# Patient Record
Sex: Male | Born: 1950 | Race: White | Hispanic: No | Marital: Single | Smoking: Never smoker
Health system: Southern US, Community
[De-identification: ages and names within clinical notes are randomized; demographics above are authoritative.]

## PROBLEM LIST (undated history)

## (undated) DIAGNOSIS — Z951 Presence of aortocoronary bypass graft: Secondary | ICD-10-CM

## (undated) DIAGNOSIS — I10 Essential (primary) hypertension: Secondary | ICD-10-CM

## (undated) DIAGNOSIS — I452 Bifascicular block: Secondary | ICD-10-CM

## (undated) DIAGNOSIS — I214 Non-ST elevation (NSTEMI) myocardial infarction: Secondary | ICD-10-CM

## (undated) DIAGNOSIS — I2510 Atherosclerotic heart disease of native coronary artery without angina pectoris: Principal | ICD-10-CM

## (undated) DIAGNOSIS — E66811 Obesity, class 1: Secondary | ICD-10-CM

## (undated) DIAGNOSIS — I25810 Atherosclerosis of coronary artery bypass graft(s) without angina pectoris: Secondary | ICD-10-CM

## (undated) DIAGNOSIS — E669 Obesity, unspecified: Secondary | ICD-10-CM

## (undated) DIAGNOSIS — E785 Hyperlipidemia, unspecified: Secondary | ICD-10-CM

## (undated) DIAGNOSIS — Z87442 Personal history of urinary calculi: Secondary | ICD-10-CM

## (undated) HISTORY — DX: Essential (primary) hypertension: I10

## (undated) HISTORY — DX: Obesity, unspecified: E66.9

## (undated) HISTORY — DX: Atherosclerosis of coronary artery bypass graft(s) without angina pectoris: I25.810

## (undated) HISTORY — PX: TONSILLECTOMY: SUR1361

## (undated) HISTORY — PX: APPENDECTOMY: SHX54

## (undated) HISTORY — DX: Atherosclerotic heart disease of native coronary artery without angina pectoris: I25.10

## (undated) HISTORY — DX: Obesity, class 1: E66.811

## (undated) HISTORY — DX: Bifascicular block: I45.2

## (undated) HISTORY — PX: HERNIA REPAIR: SHX51

## (undated) HISTORY — DX: Presence of aortocoronary bypass graft: Z95.1

## (undated) HISTORY — DX: Non-ST elevation (NSTEMI) myocardial infarction: I21.4

## (undated) HISTORY — DX: Hyperlipidemia, unspecified: E78.5

---

## 2005-03-05 DIAGNOSIS — I2510 Atherosclerotic heart disease of native coronary artery without angina pectoris: Secondary | ICD-10-CM

## 2005-03-05 DIAGNOSIS — Z951 Presence of aortocoronary bypass graft: Secondary | ICD-10-CM

## 2005-03-05 DIAGNOSIS — I214 Non-ST elevation (NSTEMI) myocardial infarction: Secondary | ICD-10-CM

## 2005-03-05 HISTORY — DX: Atherosclerotic heart disease of native coronary artery without angina pectoris: I25.10

## 2005-03-05 HISTORY — DX: Presence of aortocoronary bypass graft: Z95.1

## 2005-03-05 HISTORY — PX: CORONARY ARTERY BYPASS GRAFT: SHX141

## 2005-03-05 HISTORY — DX: Non-ST elevation (NSTEMI) myocardial infarction: I21.4

## 2005-03-10 ENCOUNTER — Inpatient Hospital Stay (HOSPITAL_COMMUNITY): Admission: EM | Admit: 2005-03-10 | Discharge: 2005-03-19 | Payer: Self-pay | Admitting: Emergency Medicine

## 2005-03-11 HISTORY — PX: LEFT HEART CATH AND CORONARY ANGIOGRAPHY: CATH118249

## 2005-03-30 ENCOUNTER — Encounter
Admission: RE | Admit: 2005-03-30 | Discharge: 2005-03-30 | Payer: Self-pay | Admitting: Thoracic Surgery (Cardiothoracic Vascular Surgery)

## 2008-04-19 ENCOUNTER — Encounter: Admission: RE | Admit: 2008-04-19 | Discharge: 2008-04-19 | Payer: Self-pay | Admitting: Cardiovascular Disease

## 2010-04-04 HISTORY — PX: NM MYOCAR SINGLE W/SPECT: HXRAD625

## 2010-07-25 ENCOUNTER — Encounter: Payer: Self-pay | Admitting: Thoracic Surgery (Cardiothoracic Vascular Surgery)

## 2010-11-20 NOTE — Discharge Summary (Signed)
Jeremy [name], CEN NO.:  [id number]   MEDICAL RECORD NO.:  [id number]          PATIENT TYPE:  INP   LOCATION:  2035                         FACILITY:  [facility]   PHYSICIAN:  Salvatore Decent. Dorris Fetch, M.D.DATE OF BIRTH:  1951-03-28   DATE OF ADMISSION:  03/10/2005  DATE OF DISCHARGE:                                 DISCHARGE SUMMARY   PRIMARY DIAGNOSES:  Severe three vessel coronary artery disease status post  myocardial infarction.   IN-HOSPITAL DIAGNOSES:  1.  Acute blood loss anemia postoperatively.  2.  Volume overload.  3.  Acute renal insufficiency.   SECONDARY DIAGNOSES:  1.  History of inguinal hernia repair on the left.  2.  Appendectomy.  3.  Tonsillectomy.   ALLERGIES:  NO KNOWN DRUG ALLERGIES.   IN-HOSPITAL OPERATIONS PROCEDURES:  1.  Cardiac catheterization.  2.  Median sternotomy with extracorporal circulation coronary bypass      grafting x5 using a left internal mammary artery posterior anterior      descending, free right internal mammary artery to obtuse marginal-1,      saphenous vein graft to first diagonal, saphenous vein graft to      posterior descending and posterolateral.   HISTORY AND PHYSICAL AND HOSPITAL COURSE:  Jeremy [name] is a 60 year old  gentleman with no prior cardiac history.  He noted one day prior to  admission, which was the date questioned of March 09, 2005, that he had  chest pain with walking to his mailbox.  He subsequently went to work and he  had some recurrent pain with exertion at work.  It did radiate to his  shoulders.  He described it as a pressure sensation.  When he was active the  intensity increased and then it relieved with rest, but it waxed and waned  all night and did not ever completely go away.  He subsequently presented to  the emergency room.  His cardiac enzymes were slightly elevated initially  and he was continued to have some slight chest pressure.  He was treated  with intravenous heparin,  nitroglycerin.  He had already taken two aspirin  at home.  He subsequently ruled in for a myocardial infarction with a CK of  647, MB of 28 and troponin of 0.79.  He subsequently was taken to cardiac  catheterization laboratory by Dr. Alanda Amass on March 11, 2005, and was  found to have a total occlusion of the OM2 and had severe three vessel  coronary artery disease with 90-95% ostial LAD.  The patient was pain-free  following catheterization.  CVTS was consulted following catheterization.  Patient was seen and evaluated by Dr. Dorris Fetch.  Dr. Dorris Fetch  discussed with the patient undergoing coronary artery bypass grafting for  these stenotic areas.  He discussed the risks and benefits of this  procedure.  Patient acknowledged an understanding and wished to proceed.  Patient was scheduled for surgery for March 12, 2005.  Patient had  preoperative bilateral carotid duplex ultrasound done showing no ICA  stenosis.  He also had preoperative ABIs which were within normal limits.  Patient was admitted to [facility] on March 10, 2005.  Patient was seen  to have a myocardial infarction.  He was taken for cardiac catheterization  on March 11, 2005.  Patient was taken to the operating room on March 12, 2005, by Dr. Dorris Fetch where he underwent coronary artery bypass  grafting x5 using left internal mammary artery to left anterior descending,  free right internal mammary artery to obtuse marginal-1, saphenous vein  graft to first diagonal, saphenous vein graft to posterior descending and  posterolateral.  Patient tolerated this procedure well and was transferred  up to the Intensive Care Unit in stable condition.  Immediately  postoperatively patient was seen to be hemodynamically stable.  He was  extubated evening of surgery.  Patient's postoperative course was pretty  much unremarkable.  Postoperative day 1 he was out of bed ambulating.  Patient's neuro was intact.  He was  in normal sinus rhythm.  He remained  hemodynamically stable.  Chest tubes and lines were dc'd.  Patient seemed to  be volume overload and started on diuretic.  Creatinine was seen to be  stable at 1.3.  As mentioned above, he was hemodynamically stable with H&H  of 10 and 28.5 with platelet count of 126.  Postop day 2, he remained in  normal sinus rhythm.  He remained hemodynamically stable.  Patient was out  of bed ambulating well.  He was sating 91-94% on 2 liters.  Creatinine had  seemed to have jumped from 1.3 to 1.8.  This was monitored.  Diuretics were  continued due to patient's volume overload.  Patient was transferred out to  2000 day 2.  Postop day 3, the patient was seen to be sating 95% on room  air.  Creatinine bumped up even further from 1.8 to 2.1.  Again diuretics  were continued.  Ace inhibitor was held off from being started.  Patient  remained in normal sinus rhythm.  Incisions were dry and intact and healing  well.  Postop day 4, patient's sternal incision, distal portion, showed  bloody serous drainage.  Skin edges were intact, no erythema or purulent  drainage noted.  This was monitored.  Creatinine again had jumped from 2.1  to 2.2.  Thought that this was due to __________ and that creatinine would  decrease over the next several days.  Diuretics were continued due to the  volume overload.  Noted, cultures were sent of the sternal drainage  postoperative day 4.  Patient remained in normal sinus rhythm.  Over the  next several days patient's creatinine began to decrease some.  It was down  to 1.9 on postop day 6.  Sternal drainage cultures showed negative.  Patient  was started on Keflex empirically on postop day 6.  Patient was out of bed  ambulating well.  Continued volume overload and diuretics were continued.  He remained hemodynamically stable throughout.  He remained to be in normal  sinus rhythm.   Patient is tentatively ready for discharge postoperative day  7.  He will be discharged home in normal sinus rhythm.  Patient will be discharged home on  room air in normal sinus rhythm with incisions healing well.  A followup  appointment is scheduled with Dr. Dorris Fetch for April 08, 2005, at 11:45  a.m.  Patient will obtain a PA & lateral chest x-ray one hour prior to this  appointment.  He will followup with Dr. Alanda Amass in 2 weeks.  He will need  to call  and schedule an appointment.  Jeremy [name] received instructions on  diet, activity level and incisional care.  He was told no driving until  released to do so, no heavy lifting over 10 pounds.  He was told to ambulate  3-4 times per day, progress as tolerated.  He was also educated to continue  his breathing exercises.  The patient was told he is allowed to shower,  washing his incisions using soap and water.  He is to contact the office if  his sternal incision starts to open or drain any purulent drainage.  He will  be discharged home on antibiotic for several days.  He was educated on diet  to be low fat, low salt.   DISCHARGE MEDICATIONS:  1.  Aspirin 325 mg p.o. daily.  2.  Lopressor 25 mg p.o. b.i.d.  3.  Zocor 40 mg p.o. daily.  4.  Keflex 500 mg p.o. t.i.d. x6 days.  5.  Lasix 40 mg p.o. daily x7 days.  6.  Potassium chloride 20 mEq p.o. daily x7 days.  7.  Niferex 150 mg p.o. daily.  8.  Folic acid 1 mg p.o. daily.  9.  Tylox 1-2 tabs p.o. q.4-6 hours p.r.n. pain.      Jeremy Belfast, PA    ______________________________  Salvatore Decent Dorris Fetch, M.D.    KMD/MEDQ  D:  03/18/2005  T:  03/18/2005  Job:  [id number]   cc:   Salvatore Decent. Dorris Fetch, M.D.  [address]  [place]  [place] [address]   Richard A. Alanda Amass, M.D.  [phone] [address], [address]  [place]  [place] [address]  Fax: [phone]

## 2010-11-20 NOTE — H&P (Signed)
Jeremy [name], Jeremy [name]                  ACCOUNT NO.:  [id number]   MEDICAL RECORD NO.:  [id number]          PATIENT TYPE:  EMS   LOCATION:  MAJO                         FACILITY:  [facility]   PHYSICIAN:  Richard A. Alanda Amass, M.D.DATE OF BIRTH:  [birth year]   DATE OF ADMISSION:  03/10/2005  DATE OF DISCHARGE:                                HISTORY & PHYSICAL   CHIEF COMPLAINT:  Chest pain.   HISTORY OF PRESENT ILLNESS:  Jeremy [name] is a 60 year old male followed by Dr.  Jeanie Sewer in [place] who works at the [employer] there.  He  has never had cardiac problems.  He has been very healthy and takes no  medications.  Yesterday, he had some chest pressure when he walked out to  his mailbox.  Yesterday afternoon, he had recurrent chest pressure at work.  He works second shift.  He describes substernal chest pressure that goes to  his shoulders.  It was worse with activity and eased with rest.  He actually  had pain off and on most of the night and only sweated a little bit.  He is  now seen in the emergency room.  His initial markers are elevated, with a  Troponin of .07.  He continues to have slight chest pressure.   PAST MEDICAL HISTORY:  Remarkable only for a prior inguinal hernia repair on  the left and a tonsillectomy.  He denies any diabetes, hypertension, or  hyperlipidemia.  He takes no medications at home.  He has no known drug  allergies.   SOCIAL HISTORY:  He never smoked.  He has a 11 year old son.  He is married  and lives with his wife.  As noted, he works at the [employer]  in [place].   FAMILY HISTORY:  Remarkable for coronary disease  His father is alive at 75  and had bypass about 6 years ago.  His mother is alive at 35 and had an MI  in her late 21s.  She also has hypertension.  The patient has one brother  and one sister, both healthy.   REVIEW OF SYSTEMS:  Essentially unremarkable except as noted above.  He  denies any history of kidney disease or  kidney problems or dysuria.  He has  not had prostate problems.  He denies any melena or GI bleeding.  He has not  had thyroid problems.  There is no history of diabetes or hypertension and  his lipid status is unknown.   PHYSICAL EXAMINATION:  VITAL SIGNS:  Blood pressure is 165/98, pulse 66,  respirations 12, O2 saturation is 98% on 2 liters.  GENERAL:  He is a well-nourished, well-developed male in no acute distress.  HEENT:  Normocephalic.  Extraocular movements are intact.  Eyes are  nonicteric.  NECK:  Without bruit and without JVD.  CHEST:  Clear to auscultation and percussion.  CARDIAC EXAM:  Regular rate and rhythm without murmur, rub or gallop, normal  S1, S2.  ABDOMEN:  Nontender, no hepatosplenomegaly.  EXTREMITIES:  Without edema.  Distal pulses are intact.  NEURO EXAM:  Grossly  intact.  He is alert and oriented and cooperative,  moves all extremities without obvious deficits.  SKIN:  Warm and dry.   As noted, his initial markers are positive, with an MB of 10.5 and a  Troponin of 0.7.  His creatinine is 0.08.  His myoglobin is 304.  His EKG  shows a sinus rhythm without acute changes.   IMPRESSION:  1.  Unstable angina, possible subendocardial myocardial infarction.  2.  Family history of coronary disease.   PLAN:  The patient was seen by Dr. Alanda Amass and myself today in the office.  He is already on IV Heparin and took 2 aspirin this morning on his own.  He  has IV nitroglycerine running.  We will admit him to the CCU and start  Integralin.  Dr. Alanda Amass would also like to give him a dose of Plavix.  He  will be put on the catheterization schedule today if possible, if not  tomorrow.      Abelino Derrick, P.A.      Richard A. Alanda Amass, M.D.  Electronically Signed    LKK/MEDQ  D:  03/10/2005  T:  03/10/2005  Job:  [id number]

## 2010-11-20 NOTE — Consult Note (Signed)
Jeremy [name], Jeremy [name]                  ACCOUNT NO.:  [id number]   MEDICAL RECORD NO.:  [id number]          PATIENT TYPE:  INP   LOCATION:  2929                         FACILITY:  [facility]   PHYSICIAN:  Salvatore Decent. Dorris Fetch, M.D.DATE OF BIRTH:  1950/12/10   DATE OF CONSULTATION:  03/11/2005  DATE OF DISCHARGE:                                   CONSULTATION   REASON FOR CONSULTATION:  Three-vessel disease, status post MI.   CHIEF COMPLAINT:  Chest tightness.   HISTORY OF PRESENT ILLNESS:  Jeremy [name] is a 60 year old gentleman with no  prior cardiac history. He noted one day prior to admission which was the  date question was March 09, 2005 that he had chest pain with walking to  his mailbox.  He subsequently went to work and he had some  recurrent pain  with exertion at work.  It did radiate to his shoulders.  He described it as  a pressure sensation.  When he was active, the intensity increased and then  it relieved with rest, but it waxed and waned all night and did not every  completely go away.  He subsequently presented to the emergency room.  His  cardiac enzymes were slightly elevated initially and he was continuing to  have some slight chest pressure. He was treated with intravenous heparin and  nitroglycerin. He had already taken two aspirin at home. He subsequently  ruled in for myocardial infarction with a CK of 647, MB of 28 and troponin  of  0.79.  He subsequently was taken to cardiac catheterization laboratory  by Dr. Alanda Amass this morning and was found to have a total occlusion of OM2  and had severe three-vessel disease with a 90 to 95%  ostial LAD stenosis.  The patient currently is pain free.   PAST MEDICAL HISTORY:  History of inguinal hernia repair on the left,  appendectomy, tonsillectomy.  Denies any history of diabetes, hypertension,  hyperlipidemia, or previous coronary disease.   He was on no medications prior to admission.   He had no known drug  allergies.   FAMILY HISTORY:  His father had bypass surgery but not until his late 17s.  He says there is a history of some members of his father's family dying in  their 3s and 48s in the past from unknown causes.  He has two siblings,  both are  healthy.   SOCIAL HISTORY:  He is married, he lives with his wife.  He works at the  [employer] in [place].  He does not smoke.   REVIEW OF SYSTEMS:  He has been feeling fatigued with less energy for  approximately a year. He does wear glasses for reading.  He denies any  paroxysmal nocturnal dyspnea, orthopnea or peripheral edema. He denies any  claudication type symptoms. All other systems were negative.   PHYSICAL EXAMINATION:  Jeremy [name] is a 60 year old gentleman in no acute  distress. He is rather stoic.  His blood pressure was 122/66, pulse 65,  respirations 18, oxygen saturations 94%.  He is 5 feet 11 inches tall,  235  pounds.  In general he is well-developed, well-nourished in no acute  distress.   HEENT:  Exam is unremarkable.  NECK:  Supple without thyromegaly, adenopathy or bruits.  CARDIAC:  Exam is a regular rate and rhythm, normal S1, S2, no murmurs, rubs  or gallops.  LUNGS:  Are with equal breath sounds bilaterally. There is no rales or  wheezing.  ABDOMEN:  Soft, nontender.  EXTREMITIES:  No cyanosis, clubbing or edema .  He has slight delay with  refilling with radial compression on the right side. He is left handed.  Skin is warm and dry. He had 2+ pulses throughout.   LABORATORY DATA:  White count is 10, hematocrit 48. Platelets 226,  hemoglobin A1c 5.5, TSH is 4.5.  Sodium 138, potassium 3.9, BUN and  creatinine of 11 and 0.8.  Glucose was 108 on admission.  His peak CK was  1190 with an MB of 116 and a troponin of 10.7.  His cholesterol was 217,  triglycerides 150, HDL 44, LDL 143, EKG showed nonspecific T-wave  abnormality.   IMPRESSION:  Jeremy [name] is a 60 year old gentleman with no prior cardiac   history.  He presents with a non-Q-wave MI. He has severe three-vessel  disease with essentially normal left ventricular function. Coronary bypass  grafting is indicated for ___________ benefit and relief of symptoms.  The  only issue with him is that he did receive 300 mg of Plavix yesterday in  anticipation of possible angioplasty at time of catheterization.  I reviewed  our options which would be to wait approximately a week for the Plavix to  wear off and decrease the bleeding risk but increase risk of recurrent  ischemia potentially in the LAD distribution which would be potentially  disastrous for him, given his previous circumflex infarct versus proceeding  with coronary grafting with the use of aprotinin and accepting somewhat the  increased bleeding risk. Jeremy [name] wishes to proceed with surgery without  delay.   I discussed in detail with the patient and his wife the indications, risks,  benefits and alternatives.  They understand the risks include but not  limited to death, stroke MI, DVT, PE, bleeding, possible need for  transfusions, infections as well as other organ system dysfunction including  respiratory, renal, hepatic or GI complications.  He understands and accepts  these risks and agrees to proceed.   We will plan bilateral mammary arteries with potential using the right  mammary as a free graft to the circumflex.  Will plan to graft the PD and PL  sequentially. He had some mild stenosis classified at 60% in the LAD beyond  the take off of the large second diagonal.  Will plan to graft that diagonal  with a vein graft and use the left mammary artery for the LAD.  All of Mr.  Matt and his wife's questions were answered.  Will plan to proceed with  surgery in the morning.           ______________________________  Salvatore Decent. Dorris Fetch, M.D.     SCH/MEDQ  D:  03/11/2005  T:  03/12/2005  Job:  [id number]   cc:   Gerlene Burdock A. Alanda Amass, M.D. [phone] [address], [address]   [place]  [place] [address]  Fax: [phone]   Antonietta Breach, M.D.

## 2010-11-20 NOTE — Discharge Summary (Signed)
NAMEVERNON, MAISH NO.:  [id number]   MEDICAL RECORD NO.:  [id number]          PATIENT TYPE:  INP   LOCATION:  2035                         [facility]ACILITY[facility]  [facility]   PHYSICIAN:  Theda Belfast, PA DATE OF BIRTH:  Jan 28, 1951   DATE OF ADMISSION:  03/10/2005  DATE OF DISCHARGE:                                 DISCHARGE SUMMARY   Audio too short to transcribe (less than 5 seconds)      Theda Belfast, PA     KMD/MEDQ  D:  03/18/2005  T:  03/18/2005  Job:  [id number]

## 2010-11-20 NOTE — Cardiovascular Report (Signed)
Jeremy [name], Jeremy [name]                  ACCOUNT NO.:  [id number]   MEDICAL RECORD NO.:  [id number]          PATIENT TYPE:  INP   LOCATION:  2906                         FACILITY:  [facility]   PHYSICIAN:  Richard A. Alanda Amass, M.D.DATE OF BIRTH:  27-Feb-1951   DATE OF PROCEDURE:  03/11/2005  DATE OF DISCHARGE:                              CARDIAC CATHETERIZATION   PROCEDURE:  1.  Retrograde central aortic catheterization __________ coronary      angiography via Judkins technique.  2.  Left ventricular angiogram, right anterior oblique/left anterior oblique      projection, subselective left internal mammary artery, right internal      mammary artery.  3.  Abdominal aortic angiogram, mid-stream posteroanterior projection and      hand injection.  4.  StarClose nitinol clip closure, C right femoral artery, successful.   DESCRIPTION OF PROCEDURE:  The patient was brought to the second floor CP  lab in the post-absorptive state after 5 mg of Valium p.o. premedication.  The right groin was prepped, draped in the usual manner.  Xylocaine 1% was  used for local anesthesia.  The patient was given 2 mg of Versed for  sedation.  The patient was not given Plavix on the day of the procedure; he  was on Integrilin drip and heparin was held.  The C RFA was entered with a  single anterior puncture using an 18 thin-walled needle and with a modified  Seldinger technique.  The 6-French short Diag sidearm sheaths were inserted  without difficulty.  Catheter was done with 6-French 4-cm tapered preformed  Cordis coronary and pigtail catheters using Omnipaque dye throughout the  procedure.  Selective coronary angiography was followed by subselective LIMA  and RIMA with the right coronary catheter by hand injection and then LV  angiogram in the RAO and LAO projection, 25 mL at 14 mL/sec and 20 mL at 12  mL/sec, respectively.  Pullback pressure in the CA was performed and showed  no gradient across the aortic valve.   Abdominal aortic angiogram was done  above the level of the renal arteries with a single hand injection.  The  catheter was removed and sidearm sheath was flushed.  A single injection  through the sidearm sheath showed good position of the sheath in the C RFA  which was smooth with no stenosis.  A StarClose nitinol clip closure device  was then utilized for successful closure of the right femoral artery.  The  right groin was stable and the patient was transferred back to the holding  area for postoperative care.  He will be continued on Integrilin and heparin  restarted pending CVTS consultation for high-grade three-vessel coronary  disease with well-preserved LV function.   PRESSURES:  LV:  120/0; LVEDP 16 mmHg; A 18-22 mmHg.  CA:  120/84 mmHg.   There is no gradient across the aortic valve on catheter pullback.   The LIMA and RIMA are widely patent.  Vertebrals are antegrade and there is  no subclavian or brachiocephalic stenosis.   The renal arteries are single and normal bilaterally.  The  infrarenal  abdominal aorta was smooth with no significant stenosis.  The iliacs were  mildly tortuous, but widely patent.   Fluoroscopy revealed 1+ LAD calcification and right coronary calcification.  No significant intracardiac or valvular calcification.   LV angiogram demonstrated some minimal hypokinesis in the mid-anterolateral  wall and posteroapical segment, no mitral regurgitation.  EF was greater  than 50%.   The main left coronary was normal.   The ostial and proximal LAD had 90% to 95% segmental concentric stenosis  without thrombus.  There were tandem post-stenotic dilatation areas.  There  was another 60% lesion at the junction of the proximal and mid-third after  the DX-2, which was moderately large and bifurcated with no significant  stenosis.   The DX-1 arose from the proximal third of the LAD before __________ 1 after  the LAD lesion bifurcated, was of moderate size and  had no significant  stenosis.  The LAD coursed to  the apex of the heart and undersurface.   The circumflex artery gave off a bifurcating proximal first marginal branch  with no significant stenosis, followed by an atrial and then an AV groove  branch.  The second marginal branch was totally occluded with no significant  antegrade filling and this was felt to be the probable culprit lesion.  The distal circumflex was tortuous, large, with no significant stenosis.   The right coronary was a dominant large vessel; it was ectatic in the  proximal third without significant stenosis.  There was 50% to 60% stenosis  at the junction of the proximal third and another 75% concentric stenosis  before the PLA and PDA bifurcation which had no significant disease beyond.   DISCUSSION:  This 60 year old white married father of 1 son is a nonsmoker  and has history of exogenous obesity and known elevated cholesterol, family  history of coronary disease with his father having CA, dying in his 31s, and  prior CABG, and mother having an MI in her 57s.  He has been on no regular  medication and has had 2 days of unstable angina and then prolonged  substernal chest pressure, prompting admission March 10, 2005.  He was  treated with IIb/IIIa inhibitors, given 300 mg of Plavix in the ER and  aspirin and heparin, nitroglycerin and beta blockers.  He has remained  stable in the hospital, has developed enzyme elevations with troponin of 10,  CK of 1190, MB of 116.  Angiography demonstrated high-grade ostial LAD  stenosis, segmental, high-grade distal RCA and total occlusion of his OM-2  (culprit lesion).  I believe the patient is a candidate for multivessel  CABG.  He did receive a single dose of 300 mg of Plavix in the emergency  room on March 10, 2005 which is cause for concern of potential bleeding and may have to delay his CABG.  We will continue heparin and IIb/IIIa  inhibitor along with medical  therapy in the interim.  I have discussed the  case with CVTS, Dr. Dorris Fetch, and he will see him in consultation.   CATHETERIZATION DIAGNOSES:  1.  Arteriosclerotic heart disease -- acute coronary syndrome with posterior      wall myocardial infarction, minimal left ventricular dysfunction, second      obtuse marginal occlusion, March 10, 2005.  2.  Hypertension, no prior treatment or diagnosis.  3.  History of hyperlipidemia.  4.  Family history of coronary disease.  5.  Nonsmoker.  6.  Exogenous obesity.  7.  High-grade  three-vessel coronary disease with well-preserved left      ventricular function.      Richard A. Alanda Amass, M.D.  Electronically Signed     RAW/MEDQ  D:  03/11/2005  T:  03/11/2005  Job:  [id number]   cc:   [facility]   [place], [place], Jonelle Sports MD   Salvatore Decent Dorris Fetch, M.D.  [address]  [place]  [place] [address]

## 2010-11-20 NOTE — Op Note (Signed)
NAMEDARNELLE, [employer]                  ACCOUNT NO.:  [id number]   MEDICAL RECORD NO.:  [id number]          PATIENT TYPE:  INP   LOCATION:  2316                         FACILITY:  [facility]   PHYSICIAN:  Salvatore Decent. Dorris Fetch, M.D.DATE OF BIRTH:  1950/10/09   DATE OF PROCEDURE:  03/12/2005  DATE OF DISCHARGE:                                 OPERATIVE REPORT   PREOPERATIVE DIAGNOSES:  Severe three-vessel coronary disease, status post  myocardial infarction.   POSTOPERATIVE DIAGNOSES:  Severe three-vessel coronary disease, status post  myocardial infarction.   PROCEDURES:  Median sternotomy, extracorporeal circulation coronary bypass  grafting x5 (left internal mammary artery to left anterior descending, free  right internal mammary artery to obtuse marginal 1, saphenous vein graft to  first diagonal, saphenous vein graft to posterior descending and posterior  lateral).   SURGEON:  Salvatore Decent. Dorris Fetch, M.D.   ASSISTANT:  Coral Ceo, P.A.   ANESTHESIA:  General.   FINDINGS:  Veins:  Small but satisfactory good quality mammaries, good  quality targets, lateral wall infarct with edema.   CLINICAL NOTE:  Mr. Kargbo is a 60 year old gentleman who presents following a  prolonged episode of substernal chest pain. He ruled in by enzymes. At  catheterization, he had severe three-vessel coronary disease and was  referred for coronary artery bypass grafting.  The indications, risks,  benefits, and alternatives were discussed in detail with the patient, and he  understood and accepted the risks as outlined in the previous note and  agreed to proceed.   OPERATIVE NOTE:  Mr. Burch was brought the preoperative holding area on  March 12, 2005. There lines were placed by the anesthesia service to  monitor arterial, central venous, and pulmonary arterial pressure. The EKG  leads were placed for continuous telemetry. Intravenous antibiotics were  administered. The patient was taken to the operating  room, anesthetized, and  intubated. A Foley catheter was placed. The chest, abdomen, and legs were  prepped and draped in the usual fashion.   A median sternotomy was performed. The left internal mammary artery was  harvested using standard technique.  Five thousand units of heparin was  administered during takedown of the graft. Simultaneously, an incision in  the medial aspect of the left leg at the level of the knee.  Only a very  small vein could be isolated at th at site.  It was not harvested. This  incision was closed. A second incision was made in the medial aspect of the  right leg again at the level of the knee, and no dominant greater saphenous  vein could be found in that region.  Therefore, the vein was harvested using  an open technique from the right-sided.  The vein was small but was  satisfactory.   The right internal mammary artery then was harvested again using the same  technique.  The remainder of the full heparin dose was given prior to  dividing the distal end of the right mammary artery.  There was excellent  flow through this graft.  It subsequently was divided proximally, and the  proximal  stump was suture ligated with a 2-0 silk suture.   After confirming adequate anticoagulation with ACT measurement taking into  account aprotinin utilization, the pericardium was opened. The ascending  aorta was inspected and palpated. There is no palpable atherosclerotic  disease. The aorta was cannulated via concentric 2-0 Ethibond pursestring  sutures.  A dual stage venous cannula was placed via a pursestring suture in  the right atrial appendage. Cardiopulmonary bypass was instituted, and the  patient was cooled to 32 degrees Celsius.  The coronary arteries were  inspected, and anastomotic sites were chosen. The conduits were inspected  and cut to length.  A foam pad was placed in the pericardium to protect the  left phrenic nerve.  A temperature probe was placed in the  myocardial  septum, and a cardioplegic cannula was placed in the ascending aorta.   The aorta was cross clamped.  The left ventricle and the aortic root vent  were placed.  Cardiac arrest was achieved combination of cold antegrade  blood cardioplegia and topical iced saline. After achieving a complete  diastolic arrest and adequate myocardial septal cooling, the following  distal anastomoses were performed.   First, a reverse saphenous vein graft was placed sequential to the posterior  descending and posterolateral branches of the right coronary.  These were  both 1.5-mm good quality targets.  A side-to-side anastomosis was performed  to the posterior descending, and an end-to-side was performed to the  posterolateral.  All anastomoses were probed proximally and distally at  their completion prior to tying the suture.  The graft flushed easily.  Cardioplegia was administered. There was good hemostasis.   Next, a reverse saphenous vein graft was placed end-to-side to the first  diagonal branch. of the LAD. This was compromised by the 95% tight proximal  LAD stenosis as well as a 60% stenosis in the LAD just beyond the takeoff of  the diagonal.  The diagonal itself was free of disease. It was a 1.5-mm good  quality target. The vein graft was anastomosed end-to-side with a running  simple 7-0 Prolene suture. Again, there was good flow and good hemostasis.   Next, the distal end of the right internal mammary artery was spatulated and  was anastomosed end-to-side to obtuse marginal 1. The OM-1 was a 1.5-mm good  quality target. The anastomosis was performed with a running 8-0 Prolene  suture.   Additional cardioplegia was administered into the vein grafts and the aortic  root. There was good backbleeding from the right mammary artery.   The left internal mammary artery then was brought through a window in the pericardium. The distal end was spatulated and was anastomosed end-to-side  to  the mid-LAD. The LAD was a 1.5-mm good quality target. The anastomosis  was performed with a running 8-0 Prolene suture. At the completion of the  mammary to LAD anastomosis, the Bulldog clamp was briefly removed from the  mammary artery to inspect for hemostasis. Immediate rapid septal rewarming  was noted.  The Bulldog clamp was replaced. The mammary pedicle was tacked  to the epicardial surface of the heart with 6-0 Prolene sutures.   Additional cardioplegia then was administered. The right mammary vein grafts  were cut to length proximally, and the proximal anastomoses were performed  to 4.0-mm punch aortotomies with running 7-0 Prolene sutures. At the  completion of the final proximal anastomoses, the patient was placed in  Trendelenburg position. Lidocaine was administered. The Bulldog clamp was  again removed from  the left mammary artery. The aortic root was de-aired,  and the aortic cross-clamp was removed. The total cross-clamp time was 96  minutes.   While the patient was being rewarmed, all proximal and distal anastomoses  were inspected for hemostasis. Epicardial pacing wires were placed on the  right ventricle and right atrium. When the patient rewarmed to a core  temperature of37 degrees Celsius, he  was weaned from cardiopulmonary bypass  without difficulty. Total bypass time was 142 minutes. The initial cardiac  index was greater than 2 L/minute per meter squared.   A test dose of protamine was administered and was well tolerated. The atrial  and aortic cannulae were removed. The remainder of the protamine was  administered. There was a transient drop in systemic pressure. There was no  pulmonary hypertension response. About midway through the protamine  infusion, this responded to volume and Neo-Synephrine, and there were no  further problems. Hemostasis was achieved. The chest was irrigated with 1  liter of warm normal saline containing 1 g of vancomycin. The  pericardium  was reapproximated with interrupted 3-0 silk sutures. Just prior to closing  the pericardium, there was a transient decrease in the patient's cardiac  index, so a low-dose dopamine infusion was initiated. Bilateral pleural and  two mediastinal chest tubes were placed through separate subcostal  incisions. The sternum was closed with interrupted heavy-gauge single and  double stainless steel wires. The remaining incisions were closed in  standard fashion. Subcuticular closure was used for the skin.  All sponge,  needle, and instrument counts were correct at the end of the procedure. The  patient was taken from the operating room to the surgical intensive care  unit in critical but stable condition.           ______________________________  Salvatore Decent Dorris Fetch, M.D.     SCH/MEDQ  D:  03/12/2005  T:  03/12/2005  Job:  [id number]   cc:   Gerlene Burdock A. Alanda Amass, M.D.  [phone], [address] [place]  [place] [address]  Fax: [phone]

## 2011-05-06 HISTORY — PX: TRANSTHORACIC ECHOCARDIOGRAM: SHX275

## 2012-11-24 ENCOUNTER — Emergency Department (HOSPITAL_COMMUNITY): Payer: Managed Care, Other (non HMO)

## 2012-11-24 ENCOUNTER — Encounter (HOSPITAL_COMMUNITY): Payer: Self-pay | Admitting: *Deleted

## 2012-11-24 ENCOUNTER — Emergency Department (HOSPITAL_COMMUNITY)
Admission: EM | Admit: 2012-11-24 | Discharge: 2012-11-25 | Disposition: A | Discharge status: Home / Self Care | Payer: Managed Care, Other (non HMO) | Attending: Emergency Medicine | Admitting: Emergency Medicine

## 2012-11-24 DIAGNOSIS — Z951 Presence of aortocoronary bypass graft: Secondary | ICD-10-CM | POA: Insufficient documentation

## 2012-11-24 DIAGNOSIS — M79609 Pain in unspecified limb: Secondary | ICD-10-CM | POA: Insufficient documentation

## 2012-11-24 DIAGNOSIS — I10 Essential (primary) hypertension: Secondary | ICD-10-CM | POA: Insufficient documentation

## 2012-11-24 DIAGNOSIS — R202 Paresthesia of skin: Secondary | ICD-10-CM

## 2012-11-24 DIAGNOSIS — R209 Unspecified disturbances of skin sensation: Secondary | ICD-10-CM | POA: Insufficient documentation

## 2012-11-24 DIAGNOSIS — E7800 Pure hypercholesterolemia, unspecified: Secondary | ICD-10-CM | POA: Insufficient documentation

## 2012-11-24 DIAGNOSIS — Z7982 Long term (current) use of aspirin: Secondary | ICD-10-CM | POA: Insufficient documentation

## 2012-11-24 DIAGNOSIS — Z79899 Other long term (current) drug therapy: Secondary | ICD-10-CM | POA: Insufficient documentation

## 2012-11-24 LAB — CBC WITH DIFFERENTIAL/PLATELET
Basophils Absolute: 0 10*3/uL (ref 0.0–0.1)
Eosinophils Absolute: 0.2 10*3/uL (ref 0.0–0.7)
HCT: 48.8 % (ref 39.0–52.0)
Monocytes Absolute: 1.2 10*3/uL — ABNORMAL HIGH (ref 0.1–1.0)
Monocytes Relative: 12 % (ref 3–12)
Platelets: 253 10*3/uL (ref 150–400)
RDW: 12.9 % (ref 11.5–15.5)
WBC: 10 10*3/uL (ref 4.0–10.5)

## 2012-11-24 LAB — COMPREHENSIVE METABOLIC PANEL
Alkaline Phosphatase: 81 U/L (ref 39–117)
CO2: 26 mEq/L (ref 19–32)
Chloride: 103 mEq/L (ref 96–112)
Creatinine, Ser: 1.05 mg/dL (ref 0.50–1.35)
GFR calc non Af Amer: 75 mL/min — ABNORMAL LOW (ref >90)
Glucose, Bld: 105 mg/dL — ABNORMAL HIGH (ref 70–99)
Total Bilirubin: 0.4 mg/dL (ref 0.3–1.2)

## 2012-11-24 LAB — POCT I-STAT TROPONIN I

## 2012-11-24 MED ORDER — ASPIRIN 325 MG PO TABS
325.0000 mg | ORAL_TABLET | ORAL | Status: AC
  Administered 2012-11-24: 325 mg via ORAL
  Filled 2012-11-24: qty 1

## 2012-11-24 NOTE — ED Notes (Signed)
Pt stated some tingling to left hand for one week, and comes and goes, feel like left hand goes to sleep at time. Denies any numbness to that left hand.  Denies any chest pain or SOB,deines any weakness. Hx bypass 2006. Stated taken Asprin with no relief.

## 2012-11-24 NOTE — ED Notes (Addendum)
C/o intermitant L arm tingling x1 week. Denies CP. Also reports "have been dealing with bad gallbladder also", some nv. Last emesis this am. Pt continually clearing throat. Mentions last EKG with Dr. Alanda Amass [facility]) was not that good. Poor historian. H/o CABG noted.

## 2012-11-24 NOTE — ED Notes (Signed)
New EKG given to Dr Fonnie Jarvis

## 2012-11-25 NOTE — ED Provider Notes (Signed)
History     CSN: [id number]  Arrival date & time 11/24/12  2128   First MD Initiated Contact with Patient 11/25/12 0005      Chief Complaint  Patient presents with  . Arm Pain    (Consider location/radiation/quality/duration/timing/severity/associated sxs/prior treatment) HPI 62 yo M with CAD, s/p 5v CABG in 2006. Patient presents with complaints of intermittent paresthesias in his left forearm and hand. "Like when you go to sleep on it wrong". Sx involve all 5 fingers. No motor weakness, per patient. Patient says that sx seem to come on when he is sitting down. Pt is RHD. Denies neck pain. No h/o trauma. No cp or back pain or sob.   Patient says that because he had not had any cardiac evaluation since 2006, he felt that he should come on in and get checked out. Sx free at this time.   Patient also wants to have his GB checked. He has had intermittent RUQ discomfort with nausea for the past few months. No fever. No vomiting.    Past Medical History  Diagnosis Date  . Hypertension   . Hypercholesteremia     Past Surgical History  Procedure Laterality Date  . Appendectomy    . Hernia repair    . Tonsillectomy    . Cardiac surgery      No family history on file.  History  Substance Use Topics  . Smoking status: Never Smoker   . Smokeless tobacco: Not on file  . Alcohol Use: No      Review of Systems Gen: no weight loss, fevers, chills, night sweats Eyes: no discharge or drainage, no occular pain or visual changes Nose: no epistaxis or rhinorrhea Mouth: no dental pain, no sore throat Neck: no neck pain Lungs: no SOB, cough, wheezing CV: no chest pain, palpitations, dependent edema or orthopnea Abd: no abdominal pain, nausea, vomiting GU: no dysuria or gross hematuria MSK: no myalgias or arthralgias Neuro: As per history of present illness, otherwise Skin: no rash Psyche: negative.  Allergies  Review of patient's allergies indicates no known  allergies.  Home Medications   Current Outpatient Rx  Name  Route  Sig  Dispense  Refill  . aspirin EC 81 MG tablet   Oral   Take 81 mg by mouth daily.         [name] lisinopril (PRINIVIL,ZESTRIL) 10 MG tablet   Oral   Take 10 mg by mouth daily.         . Multiple Vitamin (MULTIVITAMIN WITH MINERALS) TABS   Oral   Take 1 tablet by mouth daily.         . rosuvastatin (CRESTOR) 20 MG tablet   Oral   Take 20 mg by mouth daily.           BP 138/95  Pulse 64  Temp(Src) 99.1 F (37.3 C) (Oral)  Resp 18  SpO2 95%  Physical Exam Gen: well developed and well nourished appearing Head: NCAT Eyes: PERL, EOMI Nose: no epistaixis or rhinorrhea Mouth/throat: mucosa is moist and pink Neck: supple, no stridor Lungs: CTA B, no wheezing, rhonchi or rales Abd: soft, AOBESE, notender, nondistended Back: no ttp, no cva ttp Skin: no rashese, wnl Neuro: CN ii-xii grossly intact, no focal deficits, 5/5 STRENGTH BOTH ARMS AND LEGS, NORMAL SPEECH, NORMAL GAIT Psyche; normal affect,  calm and cooperative.   ED Course  Procedures (including critical care time)  Labs Reviewed  COMPREHENSIVE METABOLIC PANEL - Abnormal; Notable for the following:  Glucose, Bld 105 (*)    GFR calc non Af Amer 75 (*)    GFR calc Af Amer 87 (*)    All other components within normal limits  CBC WITH DIFFERENTIAL - Abnormal; Notable for the following:    Monocytes Absolute 1.2 (*)    All other components within normal limits  POCT I-STAT TROPONIN I   Dg Chest 2 View  11/24/2012   *RADIOLOGY REPORT*  Clinical Data: Left arm pain and flank pain, vomiting  CHEST - 2 VIEW  Comparison: 03/30/2005  Findings: Evidence of CABG. Aorta is ectatic and unfolded.  Heart size normal. Diffusely increased interstitial lung markings noted, without focal pulmonary opacity.  No pleural effusion.  No acute osseous finding.  IMPRESSION: No acute cardiopulmonary process.   Original Report Authenticated By: Christiana Pellant, M.D.      EKG: nsr, no acute ischemic changes, normal intervals, normal axis, normal qrs complex   MDM  Patient reassured of normal neuro exam, ekg, cxr and labs. Counseled to f/u with PCP to discuss routine Cardiology f/u and possible indication for nonemergent U/S of the RUQ. Patient satisfied with this plan and stable for d/c.         Brandt Loosen, MD 11/25/12 [phone]

## 2013-04-03 ENCOUNTER — Encounter: Payer: Self-pay | Admitting: Cardiovascular Disease

## 2013-04-03 ENCOUNTER — Other Ambulatory Visit: Payer: Self-pay | Admitting: Cardiovascular Disease

## 2013-04-03 LAB — CBC WITH DIFFERENTIAL/PLATELET
Basophils Absolute: 0 10*3/uL (ref 0.0–0.1)
Basophils Relative: 0 % (ref 0–1)
Eosinophils Relative: 3 % (ref 0–5)
Lymphs Abs: 1.6 10*3/uL (ref 0.7–4.0)
MCV: 90.1 fL (ref 78.0–100.0)
Monocytes Relative: 14 % — ABNORMAL HIGH (ref 3–12)
Neutro Abs: 6.3 10*3/uL (ref 1.7–7.7)
RBC: 5.37 MIL/uL (ref 4.22–5.81)
RDW: 13 % (ref 11.5–15.5)
WBC: 9.6 10*3/uL (ref 4.0–10.5)

## 2013-04-03 LAB — COMPREHENSIVE METABOLIC PANEL
AST: 22 U/L (ref 0–37)
Albumin: 4.2 g/dL (ref 3.5–5.2)
Alkaline Phosphatase: 68 U/L (ref 39–117)
Calcium: 9.5 mg/dL (ref 8.4–10.5)
Total Bilirubin: 0.9 mg/dL (ref 0.3–1.2)
Total Protein: 7.2 g/dL (ref 6.0–8.3)

## 2013-04-03 LAB — TSH: TSH: 2.49 u[IU]/mL (ref 0.350–4.500)

## 2013-04-03 LAB — PSA: PSA: 0.62 ng/mL (ref <=4.00)

## 2013-04-05 ENCOUNTER — Telehealth: Payer: Self-pay | Admitting: *Deleted

## 2013-04-05 NOTE — Telephone Encounter (Signed)
Copy of lab work (CBC with Diff, CMP, TSH, PSA - ordered by Dr. Jonette Eva) mailed to patient 04/05/13

## 2013-04-10 ENCOUNTER — Telehealth: Payer: Self-pay | Admitting: Cardiovascular Disease

## 2013-04-10 NOTE — Telephone Encounter (Signed)
Mailed out copy of patient labs. ST

## 2013-04-10 NOTE — Telephone Encounter (Signed)
Pt is calling in regards to having his blood work results mailed to him. He said that he spoke to you about it.

## 2013-04-11 NOTE — Telephone Encounter (Signed)
Pt.s  Lab results mail out to him

## 2013-08-06 ENCOUNTER — Telehealth: Payer: Self-pay | Admitting: *Deleted

## 2013-08-06 DIAGNOSIS — E782 Mixed hyperlipidemia: Secondary | ICD-10-CM

## 2013-08-06 DIAGNOSIS — Z79899 Other long term (current) drug therapy: Secondary | ICD-10-CM

## 2013-08-06 NOTE — Telephone Encounter (Signed)
Please advise on pre-office visit blood work.   Chart has been requested

## 2013-08-06 NOTE — Telephone Encounter (Signed)
Spoke to wife. Informed wife that lab slip for cmp and lipid will be mailed to her husband. If he require any other labs  Please contact his primary to get a labslip to do at the same time. wife voiced understanding. Mailed labslip.

## 2013-08-06 NOTE — Telephone Encounter (Signed)
Pt scheduled his appointment with Dr. Ellyn Hack and Dr. Rollene Fare used to check his blood work every 6 months so he requested that we send him a lab slip so he can get it done before his appointment.  [place]

## 2013-08-13 ENCOUNTER — Encounter (HOSPITAL_COMMUNITY): Payer: Self-pay | Admitting: Emergency Medicine

## 2013-08-13 ENCOUNTER — Emergency Department (HOSPITAL_COMMUNITY)
Admission: EM | Admit: 2013-08-13 | Discharge: 2013-08-14 | Disposition: A | Discharge status: Home / Self Care | Payer: Managed Care, Other (non HMO) | Attending: Emergency Medicine | Admitting: Emergency Medicine

## 2013-08-13 ENCOUNTER — Emergency Department (HOSPITAL_COMMUNITY): Payer: Managed Care, Other (non HMO)

## 2013-08-13 DIAGNOSIS — K219 Gastro-esophageal reflux disease without esophagitis: Secondary | ICD-10-CM

## 2013-08-13 DIAGNOSIS — R142 Eructation: Secondary | ICD-10-CM | POA: Insufficient documentation

## 2013-08-13 DIAGNOSIS — R141 Gas pain: Secondary | ICD-10-CM | POA: Insufficient documentation

## 2013-08-13 DIAGNOSIS — Z79899 Other long term (current) drug therapy: Secondary | ICD-10-CM | POA: Insufficient documentation

## 2013-08-13 DIAGNOSIS — E7800 Pure hypercholesterolemia, unspecified: Secondary | ICD-10-CM | POA: Insufficient documentation

## 2013-08-13 DIAGNOSIS — Z9889 Other specified postprocedural states: Secondary | ICD-10-CM | POA: Insufficient documentation

## 2013-08-13 DIAGNOSIS — R143 Flatulence: Secondary | ICD-10-CM

## 2013-08-13 DIAGNOSIS — I10 Essential (primary) hypertension: Secondary | ICD-10-CM | POA: Insufficient documentation

## 2013-08-13 DIAGNOSIS — IMO0001 Reserved for inherently not codable concepts without codable children: Secondary | ICD-10-CM

## 2013-08-13 LAB — URINALYSIS, ROUTINE W REFLEX MICROSCOPIC
Glucose, UA: NEGATIVE mg/dL
HGB URINE DIPSTICK: NEGATIVE
KETONES UR: NEGATIVE mg/dL
Leukocytes, UA: NEGATIVE
Nitrite: NEGATIVE
Protein, ur: NEGATIVE mg/dL
SPECIFIC GRAVITY, URINE: 1.027 (ref 1.005–1.030)
UROBILINOGEN UA: 1 mg/dL (ref 0.0–1.0)
pH: 5.5 (ref 5.0–8.0)

## 2013-08-13 LAB — COMPREHENSIVE METABOLIC PANEL
ALK PHOS: 78 U/L (ref 39–117)
ALT: 21 U/L (ref 0–53)
AST: 32 U/L (ref 0–37)
Albumin: 3.6 g/dL (ref 3.5–5.2)
BILIRUBIN TOTAL: 0.4 mg/dL (ref 0.3–1.2)
BUN: 13 mg/dL (ref 6–23)
CO2: 22 mEq/L (ref 19–32)
Calcium: 9.3 mg/dL (ref 8.4–10.5)
Chloride: 105 mEq/L (ref 96–112)
Creatinine, Ser: 0.91 mg/dL (ref 0.50–1.35)
GFR calc non Af Amer: 90 mL/min — ABNORMAL LOW (ref >90)
GLUCOSE: 89 mg/dL (ref 70–99)
POTASSIUM: 4.9 meq/L (ref 3.7–5.3)
SODIUM: 140 meq/L (ref 137–147)
TOTAL PROTEIN: 7.5 g/dL (ref 6.0–8.3)

## 2013-08-13 LAB — CBC WITH DIFFERENTIAL/PLATELET
Basophils Absolute: 0 10*3/uL (ref 0.0–0.1)
Basophils Relative: 0 % (ref 0–1)
EOS ABS: 0.3 10*3/uL (ref 0.0–0.7)
Eosinophils Relative: 4 % (ref 0–5)
HCT: 47.5 % (ref 39.0–52.0)
Hemoglobin: 16.6 g/dL (ref 13.0–17.0)
LYMPHS ABS: 1.8 10*3/uL (ref 0.7–4.0)
Lymphocytes Relative: 21 % (ref 12–46)
MCH: 32.9 pg (ref 26.0–34.0)
MCHC: 34.9 g/dL (ref 30.0–36.0)
MCV: 94.2 fL (ref 78.0–100.0)
Monocytes Absolute: 1.4 10*3/uL — ABNORMAL HIGH (ref 0.1–1.0)
Monocytes Relative: 17 % — ABNORMAL HIGH (ref 3–12)
NEUTROS ABS: 4.7 10*3/uL (ref 1.7–7.7)
NEUTROS PCT: 57 % (ref 43–77)
Platelets: 230 10*3/uL (ref 150–400)
RBC: 5.04 MIL/uL (ref 4.22–5.81)
RDW: 12.7 % (ref 11.5–15.5)
WBC: 8.3 10*3/uL (ref 4.0–10.5)

## 2013-08-13 LAB — LIPASE, BLOOD: Lipase: 29 U/L (ref 11–59)

## 2013-08-13 MED ORDER — ONDANSETRON 4 MG PO TBDP
8.0000 mg | ORAL_TABLET | Freq: Once | ORAL | Status: AC
  Administered 2013-08-13: 8 mg via ORAL
  Filled 2013-08-13: qty 2

## 2013-08-13 MED ORDER — GI COCKTAIL ~~LOC~~
30.0000 mL | Freq: Once | ORAL | Status: AC
  Administered 2013-08-13: 30 mL via ORAL
  Filled 2013-08-13: qty 30

## 2013-08-13 NOTE — ED Provider Notes (Signed)
CSN: [id number]     Arrival date & time 08/13/13  1704 History   First MD Initiated Contact with Patient 08/13/13 2303     Chief Complaint  Patient presents with  . Abdominal Pain     (Consider location/radiation/quality/duration/timing/severity/associated sxs/prior Treatment) Patient is a 63 y.o. male presenting with abdominal pain. The history is provided by the patient.  Abdominal Pain Pain location:  R flank Pain quality: cramping   Pain radiates to:  Does not radiate Pain severity:  Moderate Onset quality:  Gradual Timing:  Intermittent Progression:  Unchanged Chronicity:  Chronic Context: eating   Relieved by:  Nothing Worsened by:  Nothing tried Ineffective treatments:  None tried Associated symptoms: vomiting   Associated symptoms: no anorexia   Risk factors: no alcohol abuse     Past Medical History  Diagnosis Date  . Hypertension   . Hypercholesteremia    Past Surgical History  Procedure Laterality Date  . Appendectomy    . Hernia repair    . Tonsillectomy    . Cardiac surgery     No family history on file. History  Substance Use Topics  . Smoking status: Never Smoker   . Smokeless tobacco: Not on file  . Alcohol Use: No    Review of Systems  Gastrointestinal: Positive for vomiting and abdominal pain. Negative for anorexia.  All other systems reviewed and are negative.      Allergies  Review of patient's allergies indicates no known allergies.  Home Medications   Current Outpatient Rx  Name  Route  Sig  Dispense  Refill  . ibuprofen (ADVIL,MOTRIN) 200 MG tablet   Oral   Take 200 mg by mouth every 6 (six) hours as needed for moderate pain.         [name] lisinopril (PRINIVIL,ZESTRIL) 10 MG tablet   Oral   Take 10 mg by mouth daily.         . rosuvastatin (CRESTOR) 20 MG tablet   Oral   Take 20 mg by mouth daily.          BP 134/89  Pulse 58  Temp(Src) 97.7 F (36.5 C) (Oral)  Wt 242 lb 9.6 oz (110.043 kg)  SpO2 98% Physical  Exam  Constitutional: He is oriented to person, place, and time. He appears well-developed and well-nourished. No distress.  HENT:  Head: Normocephalic and atraumatic.  Mouth/Throat: Oropharynx is clear and moist.  Eyes: Conjunctivae are normal. Pupils are equal, round, and reactive to light.  Neck: Normal range of motion. Neck supple.  Cardiovascular: Normal rate, regular rhythm and intact distal pulses.   Pulmonary/Chest: Effort normal and breath sounds normal. He has no wheezes. He has no rales.  Abdominal: Soft. Bowel sounds are increased. There is no tenderness. There is no rebound and no guarding.  Musculoskeletal: Normal range of motion.  Neurological: He is alert and oriented to person, place, and time.  Skin: Skin is warm and dry.  Psychiatric: He has a normal mood and affect.    ED Course  Procedures (including critical care time) Labs Review Labs Reviewed  CBC WITH DIFFERENTIAL - Abnormal; Notable for the following:    Monocytes Relative 17 (*)    Monocytes Absolute 1.4 (*)    All other components within normal limits  COMPREHENSIVE METABOLIC PANEL - Abnormal; Notable for the following:    GFR calc non Af Amer 90 (*)    All other components within normal limits  URINALYSIS, ROUTINE W REFLEX MICROSCOPIC - Abnormal; Notable  for the following:    Bilirubin Urine SMALL (*)    All other components within normal limits  LIPASE, BLOOD   Imaging Review No results found.  EKG Interpretation   None       MDM   Final diagnoses:  None    Symptoms consistent with gas and GERD will start carafate and PPI and refer to GI    Hubert Raatz K Lam Mccubbins-Rasch, MD 08/14/13 4235

## 2013-08-13 NOTE — ED Notes (Signed)
Pt with R sided abdominal pain x 1 hour.  States the pain is increasing and he is vomiting more and more after eating.  Pain is constant and increases with palpation and pain radiates to R flank when palpated.  Was tested for gallstones with neg results.

## 2013-08-14 MED ORDER — SUCRALFATE 1 GM/10ML PO SUSP: 1.0000 g | Freq: Three times a day (TID) | ORAL | Status: DC

## 2013-08-14 MED ORDER — OMEPRAZOLE 20 MG PO CPDR: 20.0000 mg | DELAYED_RELEASE_CAPSULE | Freq: Every day | ORAL | Status: DC

## 2013-08-14 NOTE — ED Notes (Signed)
Patient transported to CT 

## 2013-09-04 ENCOUNTER — Telehealth: Payer: Self-pay | Admitting: *Deleted

## 2013-09-07 LAB — COMPREHENSIVE METABOLIC PANEL
ALBUMIN: 4 g/dL (ref 3.5–5.2)
ALK PHOS: 75 U/L (ref 39–117)
ALT: 18 U/L (ref 0–53)
AST: 20 U/L (ref 0–37)
BUN: 14 mg/dL (ref 6–23)
CHLORIDE: 103 meq/L (ref 96–112)
CO2: 25 meq/L (ref 19–32)
Calcium: 9.6 mg/dL (ref 8.4–10.5)
Creat: 0.94 mg/dL (ref 0.50–1.35)
GLUCOSE: 92 mg/dL (ref 70–99)
Potassium: 4.4 mEq/L (ref 3.5–5.3)
Sodium: 139 mEq/L (ref 135–145)
TOTAL PROTEIN: 7.2 g/dL (ref 6.0–8.3)
Total Bilirubin: 0.7 mg/dL (ref 0.2–1.2)

## 2013-09-07 LAB — LIPID PANEL
CHOL/HDL RATIO: 2.6 ratio
CHOLESTEROL: 144 mg/dL (ref 0–200)
HDL: 55 mg/dL (ref >39)
LDL CALC: 67 mg/dL (ref 0–99)
TRIGLYCERIDES: 111 mg/dL (ref <150)
VLDL: 22 mg/dL (ref 0–40)

## 2013-09-12 NOTE — Telephone Encounter (Signed)
Quick Note:  Lipids and chemistries look great!!  Leonie Man, MD  ______

## 2013-09-13 ENCOUNTER — Ambulatory Visit (INDEPENDENT_AMBULATORY_CARE_PROVIDER_SITE_OTHER): Payer: Managed Care, Other (non HMO) | Admitting: Cardiology

## 2013-09-13 ENCOUNTER — Encounter: Payer: Self-pay | Admitting: Cardiology

## 2013-09-13 VITALS — BP 132/84 | HR 67 | Ht 71.0 in | Wt 243.6 lb

## 2013-09-13 DIAGNOSIS — I2510 Atherosclerotic heart disease of native coronary artery without angina pectoris: Secondary | ICD-10-CM

## 2013-09-13 DIAGNOSIS — D751 Secondary polycythemia: Secondary | ICD-10-CM

## 2013-09-13 DIAGNOSIS — D45 Polycythemia vera: Secondary | ICD-10-CM

## 2013-09-13 DIAGNOSIS — E785 Hyperlipidemia, unspecified: Secondary | ICD-10-CM

## 2013-09-13 DIAGNOSIS — E669 Obesity, unspecified: Secondary | ICD-10-CM

## 2013-09-13 DIAGNOSIS — I10 Essential (primary) hypertension: Secondary | ICD-10-CM | POA: Insufficient documentation

## 2013-09-13 DIAGNOSIS — Z951 Presence of aortocoronary bypass graft: Secondary | ICD-10-CM

## 2013-09-13 NOTE — Patient Instructions (Signed)
  Continue with exercise as you have been doing. In an effort to loose weight -- try to eat more fruits & vegetables & less fatty/starchy foods  Smaller portions of proteins.  I will check a detailed cholesterol panel in 6 months along with other blood work: complete blood count, chemistries, thyroid function.

## 2013-09-15 ENCOUNTER — Encounter: Payer: Self-pay | Admitting: Cardiology

## 2013-09-15 DIAGNOSIS — D751 Secondary polycythemia: Secondary | ICD-10-CM | POA: Insufficient documentation

## 2013-09-15 NOTE — Assessment & Plan Note (Addendum)
Most recent Myoview was in 2011 with no sign of ischemia. Would be due for one near the end of this year. We will likely order on after his followup visit in 6 months.

## 2013-09-15 NOTE — Assessment & Plan Note (Signed)
He has a history of relatively high hemoglobin and hematocrit levels. It then followed by Dr. Rollene Fare in the past. Will check a CBC at his next lab draw. He's also had up-and-down thyroid levels which is low we'll check a TSH and free T4 level

## 2013-09-15 NOTE — Assessment & Plan Note (Signed)
Currently at goal below 70. This is from his recent checks. He would like to have a full evaluation before his September visit, therefore I would like to actually get an MR panel all of which she panel looked pretty good I will get a TSH and free T4 as well as CBC. Has not been routinely seeing his PCP, so is not had routine labs checked in a while besides his chemistries and lipids.

## 2013-09-15 NOTE — Assessment & Plan Note (Signed)
Weight is pretty stable. He is opened lose some weight  over the summer time. Overall losing weight at a difficulty for him.

## 2013-09-15 NOTE — Progress Notes (Signed)
PATIENT: Jeremy [name] MRN: [id number] DOB: 12-03-1950 PCP: Carlus Pavlov, MD  Clinic Note: Chief Complaint  Patient presents with  . 6 month visit    no chest pain, no sob, no edema, went ER IN FEB 2015   HPI: Jeremy [name] is a 63 y.o. male with a PMH below who presents today for six-month followup for cardiology but reestablishment of new cardiology care after time Dr. Rollene Fare.[name] He was last seen by Dr. Rollene Fare in September. He was quite stable at that time without any medication changes. He really is on minimal medication since his CABG in 2006.  Interval History: He presents today as is usual for him quite well no acute complaints. He denies any new cardiology symptoms. He walks some outside as well as on the treadmill but most days at least 30 minutes a day. He does have arthritis pains for which he takes Advil daily. Therefore that is aspirin. His problem will wait since September, but says that he usually gets this weight off over the summer.  No chest pain or shortness of breath with rest or exertion. No PND, orthopnea or edema. No palpitations, lightheadedness, dizziness, weakness or syncope/near syncope. No TIA/amaurosis fugax symptoms. No melena, hematochezia hematuria.  Past Medical History  Diagnosis Date  . Non-ST elevation myocardial infarction (NSTEMI), subendocardial infarction Sept 2006  . CAD in native artery Sept 2006    Prox LAD, OM3 100%, RCA-->CABG  . 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 HTN (hypertension), benign   . HLD (hyperlipidemia)   . Obesity (BMI 30.0-34.9)    Past Surgical History  Procedure Laterality Date  . Cardiac catheterization  Sept 7, 2006    requiring CABG; (pLAD 90&95%, mid 75%, D1 & D2 - patent; Cx-OM1 ok, OM2 100%, distal Cx mild disease; RCA 50-60% mid, 75% distal)  . Coronary artery bypass graft  SEPT 2006    CABGX 5: fr LIMA-LAD, fr RIMA-OM1, SVG-D1 , seq SVG-PDA/PLA   . Nm myocar single  w/spect  Oct 2011    LOW RISK: and no significant ischemia  . Doppler echocardiography  NOV 2012    EF 50 TO 55%,NO WALL MOTION ABNORM   . Appendectomy    . Hernia repair    . Tonsillectomy      No Known Allergies  Current Outpatient Prescriptions  Medication Sig Dispense Refill  . ibuprofen (ADVIL,MOTRIN) 200 MG tablet Take 200 mg by mouth every 6 (six) hours as needed for moderate pain.      [name] lisinopril (PRINIVIL,ZESTRIL) 10 MG tablet Take 10 mg by mouth daily.      . rosuvastatin (CRESTOR) 20 MG tablet Take 20 mg by mouth daily.       No current facility-administered medications for this visit.    History   Social History Narrative   Married father of one. 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  Works full-time at [employer].(12 hr shifts)   Never smoked.     Walks daily for ~30 min/day.     Family History:  Heart attack (age of onset: 26) in his mother; Heart attack (age of onset: 39) in his father.  ROS: A comprehensive Review of Systems - Negative except Osteoarthritis pains.  PHYSICAL EXAM BP 132/84  Pulse 67  Ht 5\' 11"  (1.803 m)  Wt 243 lb 9.6 oz (110.496 kg)  BMI 33.99 kg/m2 General appearance: alert, cooperative, appears stated age, no distress and mildly obese; positive  mood and affect  HEENT: [place]/AT, EOMI, MMM, anicteric sclera Neck: no adenopathy, no carotid bruit, no JVD and supple, symmetrical, trachea midline Lungs: clear to auscultation bilaterally, normal percussion bilaterally and Nonlabored, good air movement Heart: normal apical impulse, regular rate and rhythm, S1 normal, S2 split, S4 present and Soft 1/6 SEM at RUSB. Abdomen: soft, non-tender; bowel sounds normal; no masses,  no organomegaly Extremities: extremities normal, atraumatic, no cyanosis or edema Pulses: 2+ and symmetric Neurologic: Alert and oriented X 3, normal strength and tone. Normal symmetric reflexes. 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 He is not taking aspirin because he is on daily Motrin.  CAD in native artery Relatively stable with no active symptoms. An ACE inhibitor and statin. Not currently on aspirin for reasons noted above. She is not on a beta blocker but his blood pressures been well-controlled. I do not see any documentation in his old records as to why he is not on a beta blocker.  S/P CABG x 5 Most recent Myoview was in 2011 with no sign of ischemia. Would be due for one near the end of this year. We will likely order on after his followup visit in 6 months.  Essential hypertension Adequately controlled on current regimen. If his pressure were to increase it would go for starting a beta blocker such as carvedilol.  Dyslipidemia, goal LDL below 70 Currently at goal below 70. This is from his recent checks. He would like to have a full evaluation before his September visit, therefore I would like to actually get an MR panel all of which she panel looked pretty good I will get a TSH and free T4 as well as CBC. Has not been routinely seeing his PCP, so is not had routine labs checked in a while besides his chemistries and lipids.  Obesity (BMI 30-39.9) Weight is pretty stable. He is opened lose some weight  over the summer time. Overall losing weight at a difficulty for him.  Polycythemia He has a history of relatively high hemoglobin and hematocrit levels. It then followed by Dr. Rollene Fare in the past. Will check a CBC at his next lab draw. He's also had up-and-down thyroid levels which is low  we'll check a TSH and free T4 level    Orders Placed This Encounter  Procedures  . NMR Lipoprofile with Lipids    Standing Status: Future     Number of Occurrences:      Standing Expiration Date: 09/14/2014  . COMPLETE METABOLIC PANEL WITH GFR    Standing Status: Future     Number of Occurrences:      Standing Expiration Date: 09/14/2014  . CBC    Standing Status: Future     Number of Occurrences:      Standing Expiration Date: 09/14/2014  . TSH    Standing Status: Future     Number of Occurrences:      Standing Expiration Date: 09/14/2014  . T4, free    Standing Status: Future  Number of Occurrences:      Standing Expiration Date: 09/14/2014  . EKG 12-Lead   No orders of the defined types were placed in this encounter.    Followup: 6 months -- after his labs are checked. And will likely order nuclear stress test at that time for graft patency.  DAVID W. Ellyn Hack, M.D., M.S. Interventional Cardiolgy [facility]

## 2013-09-15 NOTE — Assessment & Plan Note (Signed)
Adequately controlled on current regimen. If his pressure were to increase it would go for starting a beta blocker such as carvedilol.

## 2013-09-15 NOTE — Assessment & Plan Note (Signed)
Relatively stable with no active symptoms. An ACE inhibitor and statin. Not currently on aspirin for reasons noted above. She is not on a beta blocker but his blood pressures been well-controlled. I do not see any documentation in his old records as to why he is not on a beta blocker.

## 2013-10-01 ENCOUNTER — Ambulatory Visit: Payer: Managed Care, Other (non HMO) | Admitting: Cardiology

## 2013-11-05 NOTE — Telephone Encounter (Signed)
Encounter Closed---11/05/13 TP 

## 2014-01-25 ENCOUNTER — Telehealth: Payer: Self-pay | Admitting: Cardiology

## 2014-01-25 DIAGNOSIS — I2510 Atherosclerotic heart disease of native coronary artery without angina pectoris: Secondary | ICD-10-CM

## 2014-01-25 DIAGNOSIS — D751 Secondary polycythemia: Secondary | ICD-10-CM

## 2014-01-25 DIAGNOSIS — Z951 Presence of aortocoronary bypass graft: Secondary | ICD-10-CM

## 2014-01-25 DIAGNOSIS — E785 Hyperlipidemia, unspecified: Secondary | ICD-10-CM

## 2014-01-25 DIAGNOSIS — I10 Essential (primary) hypertension: Secondary | ICD-10-CM

## 2014-01-25 NOTE — Telephone Encounter (Signed)
Returned call to patient cmp and lipid lab order mailed.

## 2014-01-25 NOTE — Telephone Encounter (Signed)
Please send a lab order before Mr. Bluett appt on 03/14/14

## 2014-01-25 NOTE — Telephone Encounter (Signed)
Mailed labslip 

## 2014-01-25 NOTE — Telephone Encounter (Signed)
Message copied by Raiford Simmonds on Fri Jan 25, 2014 12:07 PM ------      Message from: [place], [place]: Thu Sep 13, 2013  9:30 AM       send LAB SLIP--cbc,t4,free,tsh,cmp gfr,nmr with lipids            Mail aug 2015-due in sept 2015 has an appt ------

## 2014-03-14 ENCOUNTER — Ambulatory Visit: Payer: Managed Care, Other (non HMO) | Admitting: Cardiology

## 2014-04-23 ENCOUNTER — Other Ambulatory Visit: Payer: Self-pay | Admitting: Cardiology

## 2014-04-23 ENCOUNTER — Telehealth: Payer: Self-pay | Admitting: Cardiology

## 2014-04-23 LAB — COMPREHENSIVE METABOLIC PANEL
ALBUMIN: 4.1 g/dL (ref 3.5–5.2)
ALT: 22 U/L (ref 0–53)
AST: 23 U/L (ref 0–37)
Alkaline Phosphatase: 66 U/L (ref 39–117)
BUN: 12 mg/dL (ref 6–23)
CALCIUM: 9.6 mg/dL (ref 8.4–10.5)
CHLORIDE: 104 meq/L (ref 96–112)
CO2: 28 mEq/L (ref 19–32)
CREATININE: 0.97 mg/dL (ref 0.50–1.35)
GLUCOSE: 91 mg/dL (ref 70–99)
Potassium: 4.3 mEq/L (ref 3.5–5.3)
Sodium: 139 mEq/L (ref 135–145)
Total Bilirubin: 0.6 mg/dL (ref 0.2–1.2)
Total Protein: 6.7 g/dL (ref 6.0–8.3)

## 2014-04-23 LAB — CBC
HCT: 46.4 % (ref 39.0–52.0)
Hemoglobin: 15.9 g/dL (ref 13.0–17.0)
MCH: 32.1 pg (ref 26.0–34.0)
MCHC: 34.3 g/dL (ref 30.0–36.0)
MCV: 93.7 fL (ref 78.0–100.0)
PLATELETS: 227 10*3/uL (ref 150–400)
RBC: 4.95 MIL/uL (ref 4.22–5.81)
RDW: 13.3 % (ref 11.5–15.5)
WBC: 7.5 10*3/uL (ref 4.0–10.5)

## 2014-04-23 LAB — T4, FREE: FREE T4: 1.11 ng/dL (ref 0.80–1.80)

## 2014-04-23 LAB — TSH: TSH: 4.256 u[IU]/mL (ref 0.350–4.500)

## 2014-04-23 NOTE — Telephone Encounter (Signed)
She need ICD-10 CODE FOR NMR WITH LIPIDS CODE- I25.10;  E78.5;  Z95.1    FREE T4, TSH CODE  D 75.1  CMP CODE I 10  ,D75.1  CBC CODE I 10 ,I25.10, D 75.1  INFORMATION GIVEN TO KATRINA

## 2014-04-23 NOTE — Telephone Encounter (Signed)
Katrina called in needing the diagnosis code for this pt.  Thanks

## 2014-04-25 LAB — NMR LIPOPROFILE WITH LIPIDS
Cholesterol, Total: 162 mg/dL (ref 100–199)
HDL Particle Number: 32.5 umol/L (ref >=30.5)
HDL SIZE: 9.4 nm (ref >=9.2)
HDL-C: 55 mg/dL (ref >39)
LDL CALC: 81 mg/dL (ref 0–99)
LDL Particle Number: 929 nmol/L (ref <1000)
LDL Size: 21.2 nm (ref >=20.8)
LP-IR SCORE: 37 (ref <=45)
Large HDL-P: 8 umol/L (ref >=4.8)
Large VLDL-P: 4.1 nmol/L — ABNORMAL HIGH (ref <=2.7)
Small LDL Particle Number: 345 nmol/L (ref <=527)
Triglycerides: 131 mg/dL (ref 0–149)
VLDL Size: 45.1 nm (ref <=46.6)

## 2014-04-30 ENCOUNTER — Ambulatory Visit (INDEPENDENT_AMBULATORY_CARE_PROVIDER_SITE_OTHER): Payer: BC Managed Care – PPO | Admitting: Cardiology

## 2014-04-30 ENCOUNTER — Encounter: Payer: Self-pay | Admitting: *Deleted

## 2014-04-30 ENCOUNTER — Encounter: Payer: Self-pay | Admitting: Cardiology

## 2014-04-30 VITALS — BP 130/84 | HR 63 | Ht 71.0 in | Wt 252.0 lb

## 2014-04-30 DIAGNOSIS — E782 Mixed hyperlipidemia: Secondary | ICD-10-CM

## 2014-04-30 DIAGNOSIS — I2510 Atherosclerotic heart disease of native coronary artery without angina pectoris: Secondary | ICD-10-CM

## 2014-04-30 DIAGNOSIS — E785 Hyperlipidemia, unspecified: Secondary | ICD-10-CM

## 2014-04-30 DIAGNOSIS — I10 Essential (primary) hypertension: Secondary | ICD-10-CM

## 2014-04-30 DIAGNOSIS — Z951 Presence of aortocoronary bypass graft: Secondary | ICD-10-CM

## 2014-04-30 DIAGNOSIS — Z79899 Other long term (current) drug therapy: Secondary | ICD-10-CM

## 2014-04-30 NOTE — Patient Instructions (Signed)
Dr Ellyn Hack has ordered the following test(s) to be done: 1 - Your physician has requested that you have an exercise stress myoview to be done in 8 months. For further information please visit [email or web address]. Please follow instruction sheet, as given.  2 - Blood work to be done in 1 year. You will receive a phone call reminder.  Your physician discussed the importance of regular exercise and recommended that you start or continue a regular exercise program for better health.  Dr Ellyn Hack wants you to follow-up in 12 months. You will receive a reminder letter in the mail one months in advance. If you don't receive a letter, please call our office to schedule the follow-up appointment.

## 2014-04-30 NOTE — Progress Notes (Signed)
PCP: Carlus Pavlov, MD  Clinic Note: Chief Complaint  Patient presents with  . 7 MONTH VISIT     RESULTS OF RECENT LABS--NO CHEST PAIN , NO SOB , NO EDEMA   HPI: Jeremy [name] is a 63 y.o. male with a PMH below who presents today for six-month followup for CAD and discussed results of his recent labs that show excellent lipid control.  Lab Results  Component Value Date   CHOL 144 09/07/2013   HDL 55 09/07/2013   LDLCALC 81 04/23/2014   TRIG 131 04/23/2014   CHOLHDL 2.6 09/07/2013    Past Medical History  Diagnosis Date  . Non-ST elevation myocardial infarction (NSTEMI), subendocardial infarction Sept 2006  . CAD in native artery Sept 2006    Prox LAD, OM3 100%, RCA-->CABG  . 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 HTN (hypertension), benign   . HLD (hyperlipidemia)   . Obesity (BMI 30.0-34.9)     Prior Cardiac Evaluation and Procedure History: Procedure Laterality Date  . Cardiac catheterization  Sept 7, 2006    requiring CABG; (pLAD 90&95%, mid 75%, D1 & D2 - patent; Cx-OM1 ok, OM2 100%, distal Cx mild disease; RCA 50-60% mid, 75% distal)  . Coronary artery bypass graft  SEPT 2006    CABGX 5: fr LIMA-LAD, fr RIMA-OM1, SVG-D1 , seq SVG-PDA/PLA   . Nm myocar single w/spect  Oct 2011    LOW RISK: and no significant ischemia  . Doppler echocardiography  NOV 2012    EF 50 TO 55%,NO WALL MOTION ABNORM    Interval History: He presents today with no major cardiac complaints he is active and denies any resting or exertional chest tightness or pressure. Dyspnea. He has arthritis aches and pains which limit his activity but he does try to work out still about 30 minutes a day. He denies any myalgias or fatigue. Despite his efforts to exercise, continues to do poorly with weight control.  No PND, orthopnea or edema. No palpitations, lightheadedness, dizziness, weakness or syncope/near syncope. No TIA/amaurosis fugax symptoms.  ROS: A comprehensive  was performed. Review of Systems  Constitutional: Negative for fever, chills, weight loss and malaise/fatigue.  HENT: Negative for congestion, hearing loss and nosebleeds.   Respiratory: Negative for cough, hemoptysis and wheezing.   Cardiovascular: Negative.  Negative for claudication.       Per history of present illness  Gastrointestinal: Negative for constipation, blood in stool and melena.  Genitourinary: Negative for hematuria.  Musculoskeletal: Positive for joint pain and neck pain. Negative for falls and myalgias.  Neurological: Negative for dizziness, tremors, sensory change, speech change, focal weakness, seizures, loss of consciousness and headaches.  Endo/Heme/Allergies: Does not bruise/bleed easily.  Psychiatric/Behavioral: Negative for depression. The patient is not nervous/anxious.   All other systems reviewed and are negative.   Current Outpatient Prescriptions on File Prior to Visit  Medication Sig Dispense Refill  . ibuprofen (ADVIL,MOTRIN) 200 MG tablet Take 200 mg by mouth every 6 (six) hours as needed for moderate pain.      [name] lisinopril (PRINIVIL,ZESTRIL) 10 MG tablet Take 10 mg by mouth daily.      . rosuvastatin (CRESTOR) 20 MG tablet Take 20 mg by mouth daily.       No current facility-administered medications on file prior to visit.   ALLERGIES REVIEWED IN EPIC -- No change SOCIAL AND FAMILY HISTORY REVIEWED IN EPIC -- No change  Wt Readings from Last 3  Encounters:  04/30/14 252 lb (114.306 kg)  09/13/13 243 lb 9.6 oz (110.496 kg)  08/13/13 242 lb 9.6 oz (110.043 kg)    PHYSICAL EXAM BP 130/84  Pulse 63  Ht 5\' 11"  (1.803 m)  Wt 252 lb (114.306 kg)  BMI 35.16 kg/m2 General appearance: alert, cooperative, appears stated age, no distress and mildly obese; positive mood and affect  HEENT: [place]/AT, EOMI, MMM, anicteric sclera  Neck: no adenopathy, no carotid bruit, no JVD and supple, symmetrical, trachea midline  Lungs: clear to auscultation bilaterally,  normal percussion bilaterally and Nonlabored, good air movement  Heart: normal apical impulse, regular rate and rhythm, S1 normal, S2 split, S4 present and Soft 1/6 SEM at RUSB.  Abdomen: soft, non-tender; bowel sounds normal; no masses, no organomegaly  Extremities: extremities normal, atraumatic, no cyanosis or edema  Pulses: 2+ and symmetric  Neurologic: Alert and oriented X 3, normal strength and tone. Normal symmetric reflexes. Normal coordination and gait   Adult ECG Report  Rate: 63 ;  Rhythm: normal sinus rhythm, left axis deviation (-71), IRBBB  Narrative Interpretation: No change from previous EKG  Recent Labs:  Lab Results  Component Value Date   CHOL 144 09/07/2013   HDL 55 09/07/2013   LDLCALC 81 04/23/2014   TRIG 131 04/23/2014   CHOLHDL 2.6 09/07/2013     Chemistry      Component Value Date/Time   NA 139 04/23/2014 1113   K 4.3 04/23/2014 1113   CL 104 04/23/2014 1113   CO2 28 04/23/2014 1113   BUN 12 04/23/2014 1113   CREATININE 0.97 04/23/2014 1113   CREATININE 0.91 08/13/2013 1715      Component Value Date/Time   CALCIUM 9.6 04/23/2014 1113   ALKPHOS 66 04/23/2014 1113   AST 23 04/23/2014 1113   ALT 22 04/23/2014 1113   BILITOT 0.6 04/23/2014 1113      ASSESSMENT / PLAN: Atherosclerotic heart disease of native coronary artery without angina pectoris Stable no active anginal symptoms. On statin and ACE inhibitor only. He is not on beta blockers do to intermittent bradycardia. Given his lack of symptoms, I declined to continue keeping him off the beta blocker once his blood pressure were to begin to rise..  S/P CABG x 5 No active symptoms. He was evaluated 2011 with Myoview. He is due to get one checked prior to his next visit. We will order this in roughly 8-9 months.  Dyslipidemia, goal LDL below 70 Excellent lipid control. Continue current dose of Crestor. No need for checking new labs until this time next year.  Essential hypertension Well-controlled on  current dose of ACE inhibitor.    Orders Placed This Encounter  Procedures  . Hepatic function panel    Standing Status: Future     Number of Occurrences:      Standing Expiration Date: 06/01/2015  . Lipid panel    Standing Status: Future     Number of Occurrences:      Standing Expiration Date: 06/01/2015  . Myocardial Perfusion Imaging    Standing Status: Future     Number of Occurrences:      Standing Expiration Date: 04/30/2015    Order Specific Question:  Where should this test be performed    Answer:  [facility]    Order Specific Question:  Type of stress    Answer:  Exercise    Order Specific Question:  Patient weight in lbs    Answer:  252  . EKG 12-Lead  No orders of the defined types were placed in this encounter.    1 - Your physician has requested that you have an exercise stress myoview to be done in 8 months. For further information please visit [email or web address]. Please follow instruction sheet, as given.  2 - Blood work to be done in 1 year. You will receive a phone call reminder.  Your physician discussed the importance of regular exercise and recommended that you start or continue a regular exercise program for better health.  Dr Ellyn Hack wants you to follow-up in 12 months.   Leonie Man, M.D., M.S. Interventional Cardiologist   Pager # [phone]

## 2014-05-02 NOTE — Assessment & Plan Note (Addendum)
Stable no active anginal symptoms. On statin and ACE inhibitor only. He is not on beta blockers do to intermittent bradycardia. Given his lack of symptoms, I declined to continue keeping him off the beta blocker once his blood pressure were to begin to rise.[name]

## 2014-05-02 NOTE — Assessment & Plan Note (Signed)
Well-controlled on current dose of ACE inhibitor. 

## 2014-05-02 NOTE — Assessment & Plan Note (Signed)
No active symptoms. He was evaluated 2011 with Myoview. He is due to get one checked prior to his next visit. We will order this in roughly 8-9 months.

## 2014-05-02 NOTE — Assessment & Plan Note (Addendum)
Excellent lipid control. Continue current dose of Crestor. No need for checking new labs until this time next year.

## 2014-05-11 IMAGING — CT CT ABD-PELV W/O CM
2 of 4 series · 17 of 46 positions shown, 19 images · non-contrast
Comparison: US ABDOMEN COMPLETE dated 04/17/2013; CT UROGRAM dated
12/06/2012; NM HEPATO W/GB/PHARM/KANIPA MEASUR dated 04/26/2013

CLINICAL DATA: Right-sided abdominal pain for 1 hr. Postprandial
vomiting.

EXAM:
CT ABDOMEN AND PELVIS WITHOUT CONTRAST
TECHNIQUE: Multidetector CT imaging of the abdomen and pelvis was performed
following the standard protocol without intravenous contrast.

[Series 2: stone study 5.0 i30f 1 · axial · 0.95mm/px · z∈[+871,+1296]mm · 14 of 95 slices shown, 16 images]
[im 5/95  soft-tissue]
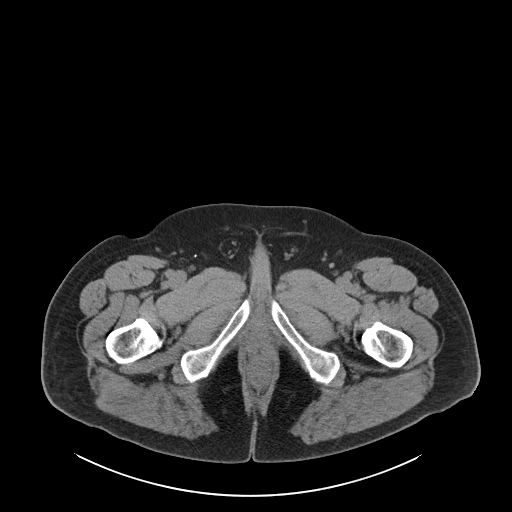
[im 5/95  bone]
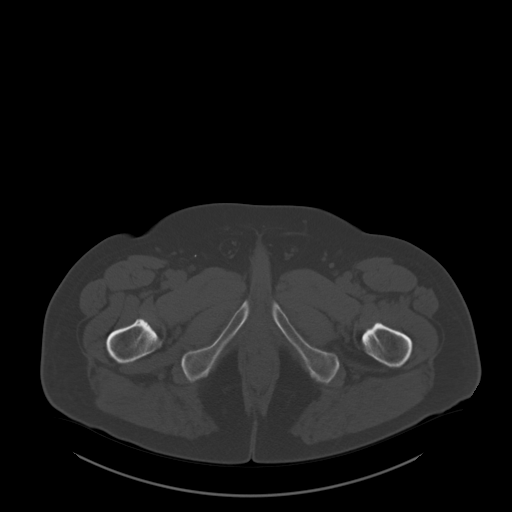
[im 13/95  soft-tissue]
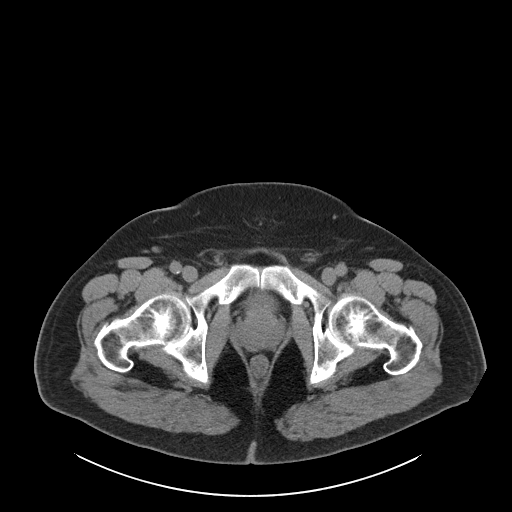
[im 17/95  soft-tissue]
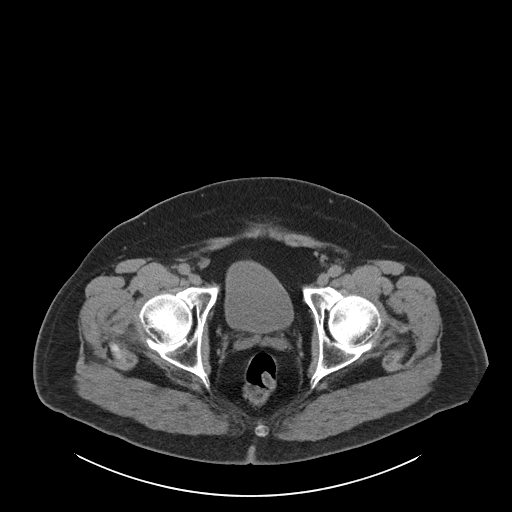
[im 25/95  soft-tissue]
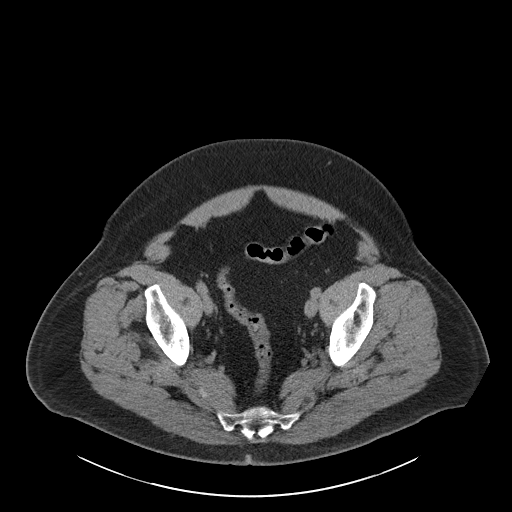
[im 33/95  soft-tissue]
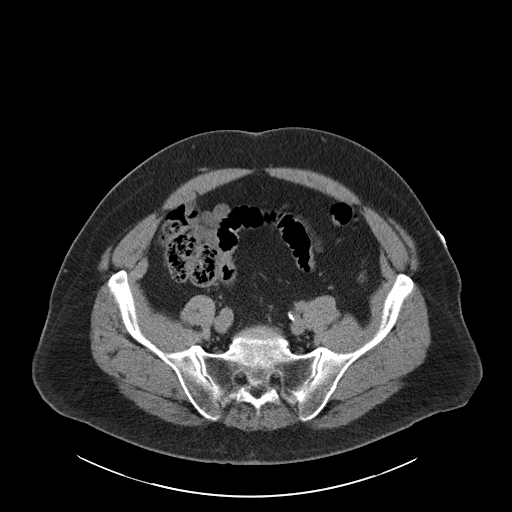
[im 37/95  soft-tissue]
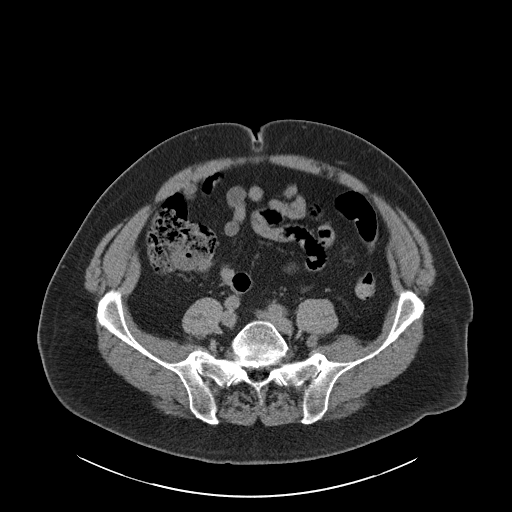
[im 45/95  soft-tissue]
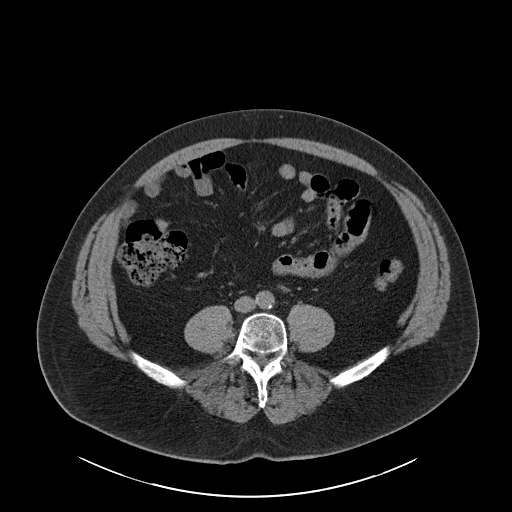
[im 50/95  soft-tissue]
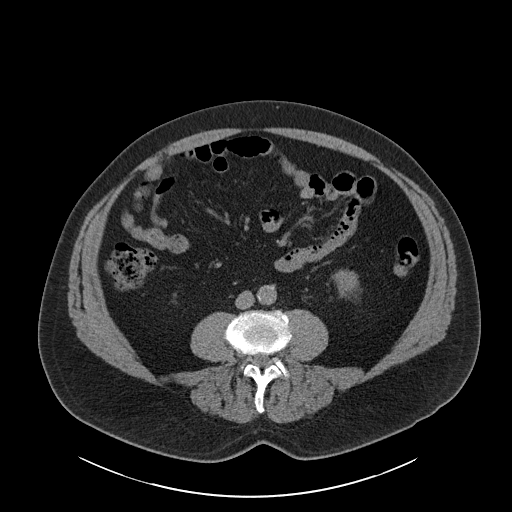
[im 58/95  soft-tissue]
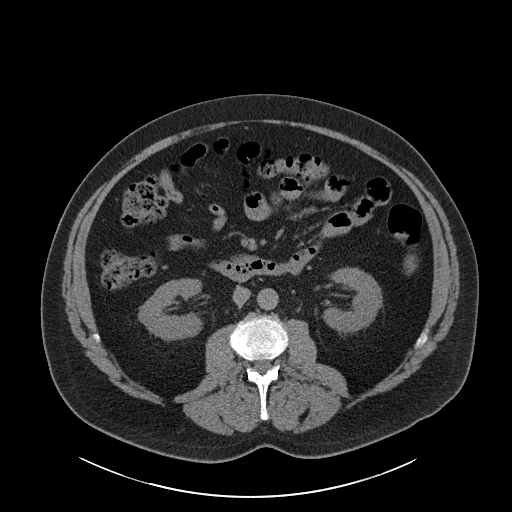
[im 58/95  bone]
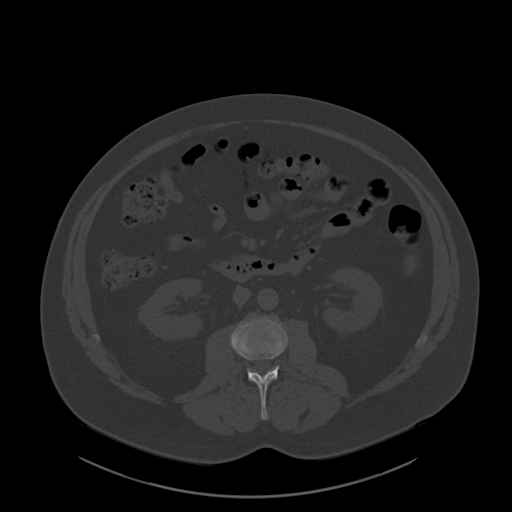
[im 62/95  soft-tissue]
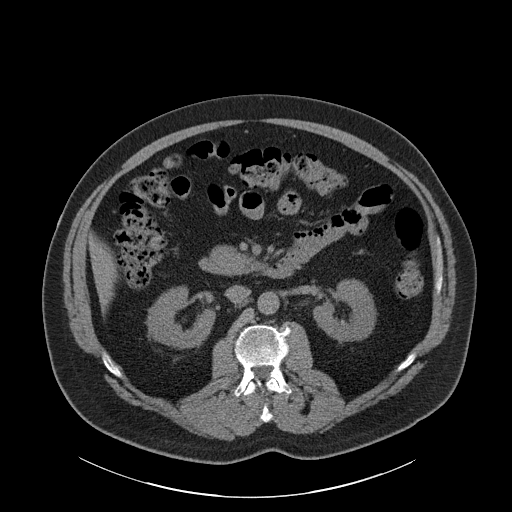
[im 70/95  soft-tissue]
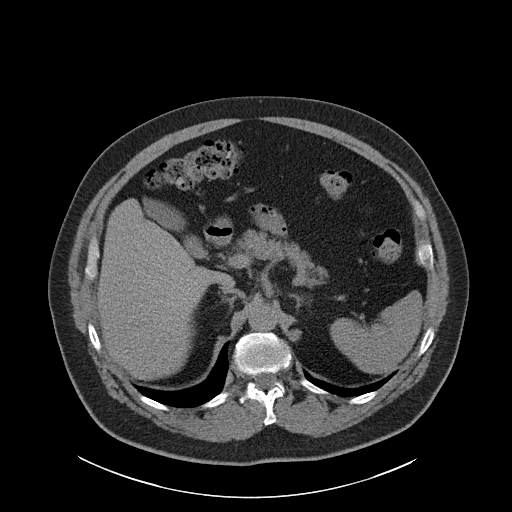
[im 78/95  soft-tissue]
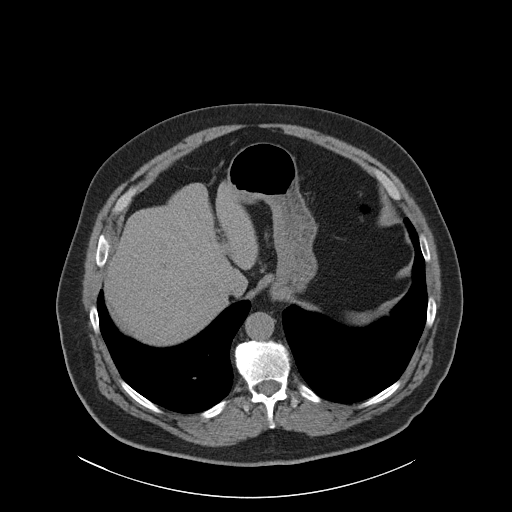
[im 82/95  soft-tissue]
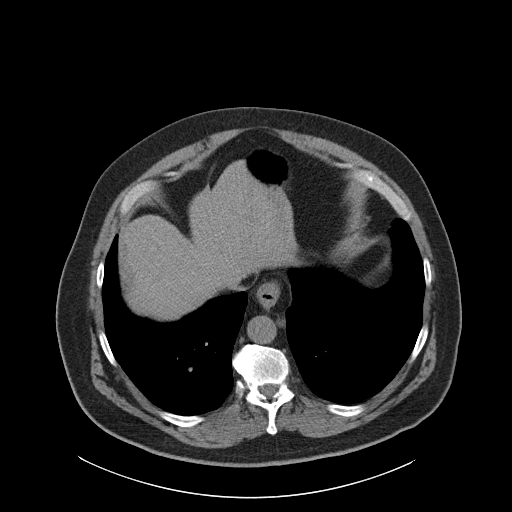
[im 90/95  soft-tissue]
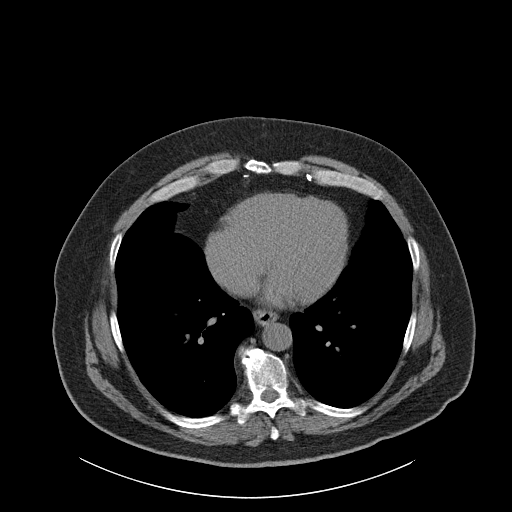

[Series 5: coronal soft tissue · coronal · 0.92mm/px · 3 of 98 slices shown]
[im 33/98  soft-tissue]
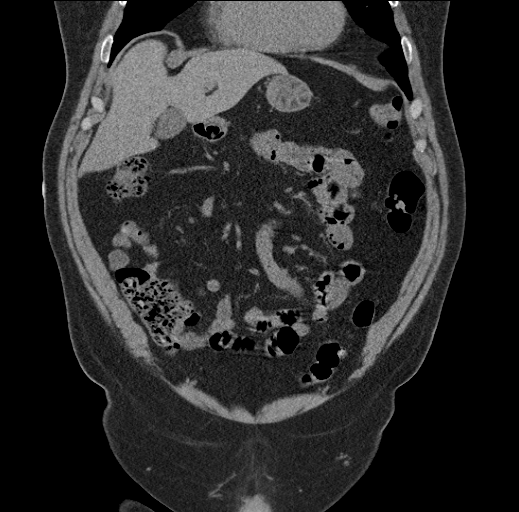
[im 44/98  soft-tissue]
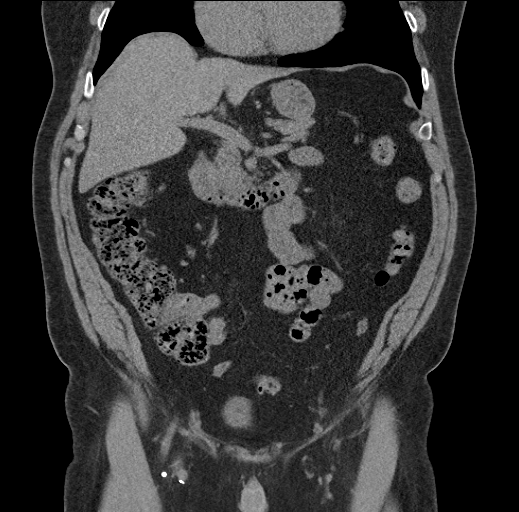
[im 54/98  soft-tissue]
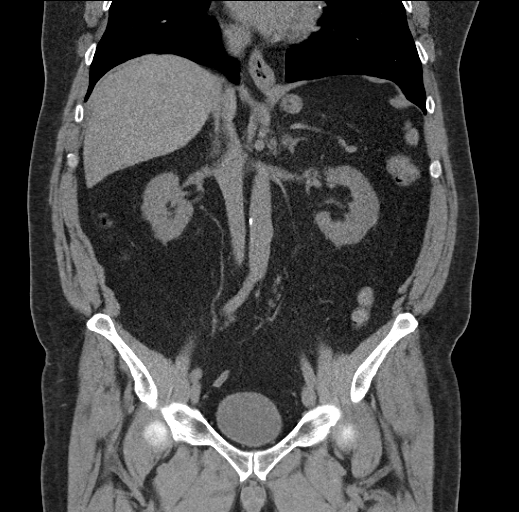

[17 of 46 positions shown; findings below may reference images not displayed]

FINDINGS: Lung Bases: Median sternotomy wires partially visualized. Patulous
distal thoracic esophagus. Clear lung bases.

Liver: Unenhanced CT was performed per clinician order. Lack of IV
contrast limits sensitivity and specificity, especially for
evaluation of abdominal/pelvic solid viscera. Grossly normal.

Spleen:  Normal.

Gallbladder:  Normal.  No calcified gallstones.

Common bile duct:  Normal.

Pancreas:  Normal.

Adrenal glands:  Normal bilaterally.

Kidneys: No renal calculi. Both ureters are within normal limits.
Negative for hydronephrosis.

Stomach:  Grossly normal, collapsed.

Small bowel: Normal. No mesenteric adenopathy or obstruction. No
inflammatory changes.

Colon: Appendix not identified. No right lower quadrant inflammatory
changes. No inflammatory changes of colon. A few scattered colonic
diverticula.

Pelvic Genitourinary: Prostate gland in urinary bladder appear
normal. No free fluid.

Bones:  No aggressive osseous lesions.

Vasculature: Atherosclerosis. No aneurysm or acute vascular
abnormality.

Body Wall: Normal.  Clips in the right inguinal region.
IMPRESSION: No acute abnormality.  Negative for urolithiasis.

## 2014-06-12 ENCOUNTER — Encounter: Payer: Self-pay | Admitting: Cardiovascular Disease

## 2015-03-24 ENCOUNTER — Telehealth: Payer: Self-pay | Admitting: Cardiology

## 2015-03-24 DIAGNOSIS — E785 Hyperlipidemia, unspecified: Secondary | ICD-10-CM

## 2015-03-24 NOTE — Telephone Encounter (Signed)
Fasting Lipids CMP & A1c  Claudett Bayly, Leonie Green, MD

## 2015-03-24 NOTE — Telephone Encounter (Signed)
Needs a Lab order mailed out to him before his appt on 05/01/15 at 9:15am   Thanks

## 2015-03-24 NOTE — Telephone Encounter (Signed)
Patient would like to have labs ordered before his annual OV on 05/01/15  Would you like to order any labs prior to his bisit?

## 2015-03-24 NOTE — Telephone Encounter (Signed)
Fasting Lipod panel, CMP and A1c ordered with labs slips sent to patient

## 2015-04-08 ENCOUNTER — Telehealth: Payer: Self-pay | Admitting: *Deleted

## 2015-04-08 DIAGNOSIS — E785 Hyperlipidemia, unspecified: Secondary | ICD-10-CM

## 2015-04-08 NOTE — Telephone Encounter (Signed)
Mailed letter and labslip hgba1c, lipid ,cmp

## 2015-04-08 NOTE — Telephone Encounter (Signed)
-----   Message from [place] sent at 04/30/2014  1:01 PM EDT ----- Regarding: Release labs Release patient's labs and mail lab slip.

## 2015-04-17 LAB — COMPREHENSIVE METABOLIC PANEL
ALT: 21 U/L (ref 9–46)
AST: 26 U/L (ref 10–35)
Albumin: 4.1 g/dL (ref 3.6–5.1)
Alkaline Phosphatase: 65 U/L (ref 40–115)
BUN: 10 mg/dL (ref 7–25)
CHLORIDE: 103 mmol/L (ref 98–110)
CO2: 27 mmol/L (ref 20–31)
Calcium: 9.3 mg/dL (ref 8.6–10.3)
Creat: 1.03 mg/dL (ref 0.70–1.25)
GLUCOSE: 88 mg/dL (ref 65–99)
POTASSIUM: 4.5 mmol/L (ref 3.5–5.3)
Sodium: 138 mmol/L (ref 135–146)
Total Bilirubin: 0.9 mg/dL (ref 0.2–1.2)
Total Protein: 6.9 g/dL (ref 6.1–8.1)

## 2015-04-17 LAB — HEMOGLOBIN A1C
Hgb A1c MFr Bld: 5.7 % — ABNORMAL HIGH (ref <5.7)
Mean Plasma Glucose: 117 mg/dL — ABNORMAL HIGH (ref <117)

## 2015-04-17 LAB — LIPID PANEL
Cholesterol: 157 mg/dL (ref 125–200)
HDL: 50 mg/dL (ref >=40)
LDL Cholesterol: 77 mg/dL (ref <130)
Total CHOL/HDL Ratio: 3.1 Ratio (ref <=5.0)
Triglycerides: 150 mg/dL — ABNORMAL HIGH (ref <150)
VLDL: 30 mg/dL (ref <30)

## 2015-04-21 ENCOUNTER — Telehealth: Payer: Self-pay | Admitting: *Deleted

## 2015-04-21 NOTE — Telephone Encounter (Signed)
Jeremy [name] is returning a call . Please call .[name]   Thanks

## 2015-04-21 NOTE — Telephone Encounter (Signed)
-----   Message from Leonie Man, MD sent at 04/21/2015  6:57 AM EDT ----- Pretty normal chemistry panel - kidney & liver function OK. Besides slightly elevated Triglycerides, the lipid panel looks good ~ stable.    Leonie Man, MD  Please forward to: Carlus Pavlov, MD

## 2015-04-21 NOTE — Telephone Encounter (Signed)
Returned call to patient no answer.No voice mail.Message sent to Dr.Harding's nurse Trixie Dredge RN.

## 2015-04-21 NOTE — Telephone Encounter (Signed)
No answer , no answer machine,- will try again

## 2015-04-23 NOTE — Telephone Encounter (Signed)
Spoke with pt, aware of lab results. 

## 2015-04-23 NOTE — Telephone Encounter (Signed)
Jeremy [name] is  Calling about his lab results , Please call.[name] BR

## 2015-05-01 ENCOUNTER — Ambulatory Visit (INDEPENDENT_AMBULATORY_CARE_PROVIDER_SITE_OTHER): Payer: 59 | Admitting: Cardiology

## 2015-05-01 VITALS — BP 134/82 | HR 68 | Ht 71.0 in | Wt 252.4 lb

## 2015-05-01 DIAGNOSIS — E669 Obesity, unspecified: Secondary | ICD-10-CM

## 2015-05-01 DIAGNOSIS — E785 Hyperlipidemia, unspecified: Secondary | ICD-10-CM | POA: Diagnosis not present

## 2015-05-01 DIAGNOSIS — I10 Essential (primary) hypertension: Secondary | ICD-10-CM | POA: Diagnosis not present

## 2015-05-01 DIAGNOSIS — I2510 Atherosclerotic heart disease of native coronary artery without angina pectoris: Secondary | ICD-10-CM

## 2015-05-01 DIAGNOSIS — Z951 Presence of aortocoronary bypass graft: Secondary | ICD-10-CM

## 2015-05-01 NOTE — Patient Instructions (Signed)
Your physician has requested that you have en exercise stress myoview in Jan 2017. For further information please visit [email or web address]. Please follow instruction sheet, as given.   No changes in current treatment.   Your physician wants you to follow-up in 12 months with Dr Ellyn Hack. You will receive a reminder letter in the mail two months in advance. If you don't receive a letter, please call our office to schedule the follow-up appointment.

## 2015-05-01 NOTE — Progress Notes (Signed)
PCP: Jeremy Pavlov, MD  Clinic Note: Chief Complaint  Patient [name] with  . Annual Exam    pt states no Sx.  . Coronary Artery Disease    h/o CABG    HPI: Jeremy [name] is a 64 y.o. male with a PMH below who [name] today for annual followup of CAD. Jeremy [name] (former Pt of Dr. Rollene [name]) has a history of non-STEMI in September 2006 which led to catheterization revealing severe three-vessel disease. He was referred for CABGx5.   Last Myoview was in 2011. Last echo in 2012   Jeremy [name] was last seen on 04/30/2014 -- doing well. No complaints.  He noted that most of his activity had been limited by arthritis pains. Was still able to exercise about 30 minutes per day.  Recent Hospitalizations: none (did have Cholecystectomy this summer) in [place]. No complications.  Studies Reviewed: none  Interval History: Jeremy [name] today without any significant symptoms of chest tightness or pressure with rest or exertion. He has been doing relatively well since last visit.  He is still limited by his arthritic pains but is trying to continue his exercise routinely.  No chest pain or shortness of breath with rest or exertion.  No PND, orthopnea or edema. No palpitations, lightheadedness, dizziness, weakness or syncope/near syncope. No TIA/amaurosis fugax symptoms. No claudication.  ROS: A comprehensive was performed. Review of Systems  Constitutional: Negative for malaise/fatigue.  HENT: Positive for nosebleeds (With allergies or dry air).   Gastrointestinal: Negative for blood in stool and melena.  Genitourinary: Negative for hematuria.  Musculoskeletal: Negative for falls.  Neurological: Negative for dizziness.  Psychiatric/Behavioral: Negative.   All other systems reviewed and are negative.   Past Medical History  Diagnosis Date  . Non-ST elevation myocardial infarction (NSTEMI), subendocardial infarction [facility]) Sept 2006  . CAD in native artery Sept 2006    Prox LAD, OM3  100%, RCA-->CABG  . 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 HTN (hypertension), benign   . HLD (hyperlipidemia)   . Obesity (BMI 30.0-34.9)     Past Surgical History  Procedure Laterality Date  . Cardiac catheterization  Sept 7, 2006    requiring CABG; (pLAD 90&95%, mid 75%, D1 & D2 - patent; Cx-OM1 ok, OM2 100%, distal Cx mild disease; RCA 50-60% mid, 75% distal)  . Coronary artery bypass graft  SEPT 2006    CABGX 5: fr LIMA-LAD, fr RIMA-OM1, SVG-D1 , seq SVG-PDA/PLA   . Nm myocar single w/spect  Oct 2011    LOW RISK: and no significant ischemia  . Doppler echocardiography  NOV 2012    EF 50 TO 55%,NO WALL MOTION ABNORM   . Appendectomy    . Hernia repair    . Tonsillectomy     Prior to Admission medications   Medication Sig Start Date End Date Taking? Authorizing Provider  ibuprofen (ADVIL,MOTRIN) 200 MG tablet Take 200 mg by mouth every 6 (six) hours as needed for moderate pain.    Historical Provider, MD  lisinopril (PRINIVIL,ZESTRIL) 10 MG tablet Take 10 mg by mouth daily.    Historical Provider, MD  rosuvastatin (CRESTOR) 20 MG tablet Take 20 mg by mouth daily.    Historical Provider, MD    No Known Allergies   Social History   Social History  . Marital Status: Single    Spouse Name: N/A  . Number of Children: N/A  . Years of Education: N/A   Social  History Main Topics  . Smoking status: Never Smoker   . Smokeless tobacco: None  . Alcohol Use: No  . Drug Use: No  . Sexual Activity: Not Asked   Other Topics Concern  . None   Social History Narrative   Married father of one. 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  Works full-time at [employer].(12 hr shifts)   Never smoked.     Walks daily for ~30 min/day.     Family History  Problem Relation Age of Onset  . Heart attack Father 10  . Heart attack Mother 63    Wt Readings from Last 3 Encounters:  05/01/15 252 lb 6.4 oz (114.488 kg)  04/30/14 252 lb  (114.306 kg)  09/13/13 243 lb 9.6 oz (110.496 kg)    PHYSICAL EXAM BP 134/82 mmHg  Pulse 68  Ht 5\' 11"  (1.803 m)  Wt 252 lb 6.4 oz (114.488 kg)  BMI 35.22 kg/m2  BP 134/82 mmHg  Pulse 68  Ht 5\' 11"  (1.803 m)  Wt 252 lb 6.4 oz (114.488 kg)  BMI 35.22 kg/m2 At home his blood pressures usually run from 110-120/70s mmHg -- was a rush this morning, and took his blood pressure medication just before leaving home. General appearance: alert, cooperative, appears stated age, no distress and mildly obese; positive mood and affect  HEENT: [place]/AT, EOMI, MMM, anicteric sclera  Neck: no adenopathy, no carotid bruit, no JVD and supple, symmetrical, trachea midline  Lungs: clear to auscultation bilaterally, normal percussion bilaterally and Nonlabored, good air movement  Heart: normal apical impulse, regular rate and rhythm, S1 normal, S2 split, S4 present and Soft 1/6 SEM at RUSB.  Abdomen: soft, non-tender; bowel sounds normal; no masses, no organomegaly  Extremities: extremities normal, atraumatic, no cyanosis or edema  Pulses: 2+ and symmetric  Neurologic: Alert and oriented X 3, normal strength and tone. Normal symmetric reflexes. 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 Computers currently rating this as incomplete right bundle branch block where is was previously read as right bundle branch block.   Other studies Reviewed: Additional studies/ records that were reviewed today include:  Recent Labs:   Lab Results  Component Value Date   CHOL 157 04/16/2015   HDL 50 04/16/2015   LDLCALC 77 04/16/2015   TRIG 150* 04/16/2015   CHOLHDL 3.1 04/16/2015   Lab Results  Component Value Date   CREATININE 1.03 04/16/2015   Lab Results  Component Value Date   HGBA1C 5.7*  04/16/2015    ASSESSMENT / PLAN: Problem List Items Addressed This Visit    S/P CABG x 5 (Chronic)    He is now 5 years since his last stress test, and10 years post CABG Thankfully no active symptoms. Plan is to repeat Myoview for routine surveillance.      Relevant Orders   EKG 12-Lead   Myocardial Perfusion Imaging   Obesity (BMI 30-39.9) (Chronic)    Stable weight from last year, but was not able to get back down to where he had been earlier in 2015. He is now trying to get back into losing weight with dietary modification and continued exercise. He is concerned about "prediabetes with an A1c of 5.7.      Relevant Orders   EKG 12-Lead   Myocardial Perfusion Imaging   Essential  hypertension - Primary (Chronic)    Initial blood pressure check was not very good, but he said he just rushed to get in on time. He therefore was probably a little bit overanxious. On my recheck his blood pressure looks much better.  If he does still some elevated blood pressures, we could consider adding a beta blocker.      Relevant Orders   EKG 12-Lead   Myocardial Perfusion Imaging   Dyslipidemia, goal LDL below 70 (Chronic)    Overall pretty well controlled lipids on current dose of Crestor. He is trying to work on losing weight with his exercise daily and Dr. Medication. Hopefully we will [place] his LDL below 70. He is very reluctant to increase dose of Crestor and further for fear of side effects.      Relevant Orders   EKG 12-Lead   Myocardial Perfusion Imaging   Atherosclerotic heart disease of native coronary artery without angina pectoris (Chronic)    He is 10 years post CABG. Last Myoview was in October 2011. Currently due for repeat evaluation with Myoview. We will schedule for January timeframe as his insurance is in flux.  No active anginal symptoms. He remains on statin and ACE inhibitor only. He takes aspirin intermittently but not all the time. He had issues with fatigue on  beta blockers in the past, we have not added for that reason.      Relevant Orders   EKG 12-Lead   Myocardial Perfusion Imaging      Current medicines are reviewed at length with the patient today. (+/- concerns) none The following changes have been made: NONE Studies Ordered:   Orders Placed This Encounter  Procedures  . Myocardial Perfusion Imaging  . EKG 12-Lead    ROV 1 YR  Chosen Garron, Leonie Green, M.D., M.S. Interventional Cardiologist   Pager # [phone]

## 2015-05-01 NOTE — Assessment & Plan Note (Signed)
He is now 5 years since his last stress test, and10 years post CABG Thankfully no active symptoms. Plan is to repeat Myoview for routine surveillance.

## 2015-05-03 ENCOUNTER — Encounter: Payer: Self-pay | Admitting: Cardiology

## 2015-05-03 NOTE — Assessment & Plan Note (Signed)
Initial blood pressure check was not very good, but he said he just rushed to get in on time. He therefore was probably a little bit overanxious. On my recheck his blood pressure looks much better.  If he does still some elevated blood pressures, we could consider adding a beta blocker.

## 2015-05-03 NOTE — Assessment & Plan Note (Signed)
Overall pretty well controlled lipids on current dose of Crestor. He is trying to work on losing weight with his exercise daily and Dr. Medication. Hopefully we will [place] his LDL below 70. He is very reluctant to increase dose of Crestor and further for fear of side effects.

## 2015-05-03 NOTE — Assessment & Plan Note (Signed)
Stable weight from last year, but was not able to get back down to where he had been earlier in 2015. He is now trying to get back into losing weight with dietary modification and continued exercise. He is concerned about "prediabetes with an A1c of 5.7.

## 2015-05-03 NOTE — Assessment & Plan Note (Signed)
He is 10 years post CABG. Last Myoview was in October 2011. Currently due for repeat evaluation with Myoview. We will schedule for January timeframe as his insurance is in flux.  No active anginal symptoms. He remains on statin and ACE inhibitor only. He takes aspirin intermittently but not all the time. He had issues with fatigue on beta blockers in the past, we have not added for that reason.

## 2015-07-15 ENCOUNTER — Encounter (HOSPITAL_COMMUNITY): Payer: 59

## 2016-05-10 ENCOUNTER — Ambulatory Visit: Payer: BLUE CROSS/BLUE SHIELD | Admitting: Cardiology

## 2017-11-02 DIAGNOSIS — I25810 Atherosclerosis of coronary artery bypass graft(s) without angina pectoris: Secondary | ICD-10-CM

## 2017-11-02 DIAGNOSIS — I452 Bifascicular block: Secondary | ICD-10-CM

## 2017-11-02 HISTORY — DX: Atherosclerosis of coronary artery bypass graft(s) without angina pectoris: I25.810

## 2017-11-02 HISTORY — DX: Bifascicular block: I45.2

## 2017-11-02 HISTORY — PX: TRANSTHORACIC ECHOCARDIOGRAM: SHX275

## 2017-11-12 ENCOUNTER — Other Ambulatory Visit: Payer: Self-pay

## 2017-11-12 ENCOUNTER — Emergency Department (HOSPITAL_COMMUNITY): Payer: PPO

## 2017-11-12 ENCOUNTER — Inpatient Hospital Stay (HOSPITAL_COMMUNITY)
Admission: EM | Admit: 2017-11-12 | Discharge: 2017-11-15 | DRG: 247 | Disposition: A | Discharge status: Home / Self Care | Payer: PPO | Attending: Cardiovascular Disease | Admitting: Cardiovascular Disease

## 2017-11-12 ENCOUNTER — Encounter (HOSPITAL_COMMUNITY): Payer: Self-pay | Admitting: Emergency Medicine

## 2017-11-12 DIAGNOSIS — Z951 Presence of aortocoronary bypass graft: Secondary | ICD-10-CM | POA: Diagnosis not present

## 2017-11-12 DIAGNOSIS — I2510 Atherosclerotic heart disease of native coronary artery without angina pectoris: Secondary | ICD-10-CM | POA: Diagnosis not present

## 2017-11-12 DIAGNOSIS — I214 Non-ST elevation (NSTEMI) myocardial infarction: Secondary | ICD-10-CM | POA: Diagnosis not present

## 2017-11-12 DIAGNOSIS — E785 Hyperlipidemia, unspecified: Secondary | ICD-10-CM | POA: Diagnosis not present

## 2017-11-12 DIAGNOSIS — R079 Chest pain, unspecified: Secondary | ICD-10-CM | POA: Diagnosis not present

## 2017-11-12 DIAGNOSIS — I25709 Atherosclerosis of coronary artery bypass graft(s), unspecified, with unspecified angina pectoris: Secondary | ICD-10-CM | POA: Diagnosis not present

## 2017-11-12 DIAGNOSIS — Z8249 Family history of ischemic heart disease and other diseases of the circulatory system: Secondary | ICD-10-CM

## 2017-11-12 DIAGNOSIS — Z955 Presence of coronary angioplasty implant and graft: Secondary | ICD-10-CM | POA: Diagnosis not present

## 2017-11-12 DIAGNOSIS — I361 Nonrheumatic tricuspid (valve) insufficiency: Secondary | ICD-10-CM | POA: Diagnosis not present

## 2017-11-12 DIAGNOSIS — I10 Essential (primary) hypertension: Secondary | ICD-10-CM | POA: Diagnosis not present

## 2017-11-12 DIAGNOSIS — K219 Gastro-esophageal reflux disease without esophagitis: Secondary | ICD-10-CM | POA: Diagnosis not present

## 2017-11-12 HISTORY — DX: Personal history of urinary calculi: Z87.442

## 2017-11-12 LAB — CBC
HEMATOCRIT: 50.3 % (ref 39.0–52.0)
Hemoglobin: 17 g/dL (ref 13.0–17.0)
MCH: 32.1 pg (ref 26.0–34.0)
MCHC: 33.8 g/dL (ref 30.0–36.0)
MCV: 94.9 fL (ref 78.0–100.0)
PLATELETS: 205 10*3/uL (ref 150–400)
RBC: 5.3 MIL/uL (ref 4.22–5.81)
RDW: 13.1 % (ref 11.5–15.5)
WBC: 11.5 10*3/uL — AB (ref 4.0–10.5)

## 2017-11-12 LAB — BASIC METABOLIC PANEL
Anion gap: 9 (ref 5–15)
BUN: 12 mg/dL (ref 6–20)
CHLORIDE: 103 mmol/L (ref 101–111)
CO2: 28 mmol/L (ref 22–32)
Calcium: 9.5 mg/dL (ref 8.9–10.3)
Creatinine, Ser: 1.16 mg/dL (ref 0.61–1.24)
GFR calc non Af Amer: >60 mL/min (ref >60)
Glucose, Bld: 94 mg/dL (ref 65–99)
POTASSIUM: 4.5 mmol/L (ref 3.5–5.1)
SODIUM: 140 mmol/L (ref 135–145)

## 2017-11-12 LAB — CBG MONITORING, ED: GLUCOSE-CAPILLARY: 87 mg/dL (ref 65–99)

## 2017-11-12 LAB — I-STAT TROPONIN, ED: Troponin i, poc: 5.4 ng/mL (ref 0.00–0.08)

## 2017-11-12 LAB — TROPONIN I: TROPONIN I: 6.14 ng/mL — AB (ref <0.03)

## 2017-11-12 LAB — MRSA PCR SCREENING: MRSA by PCR: NEGATIVE

## 2017-11-12 LAB — HEPARIN LEVEL (UNFRACTIONATED): Heparin Unfractionated: 0.38 IU/mL (ref 0.30–0.70)

## 2017-11-12 MED ORDER — ACETAMINOPHEN 325 MG PO TABS
650.0000 mg | ORAL_TABLET | ORAL | Status: DC | PRN
  Administered 2017-11-14: 650 mg via ORAL
  Filled 2017-11-12: qty 2

## 2017-11-12 MED ORDER — ASPIRIN 81 MG PO CHEW
324.0000 mg | CHEWABLE_TABLET | Freq: Once | ORAL | Status: AC
  Administered 2017-11-12: 324 mg via ORAL
  Filled 2017-11-12: qty 4

## 2017-11-12 MED ORDER — NITROGLYCERIN 0.4 MG SL SUBL: 0.4000 mg | SUBLINGUAL_TABLET | SUBLINGUAL | Status: DC | PRN

## 2017-11-12 MED ORDER — HEPARIN BOLUS VIA INFUSION
4000.0000 [IU] | Freq: Once | INTRAVENOUS | Status: AC
Start: 2017-11-12 — End: 2017-11-12
  Administered 2017-11-12: 4000 [IU] via INTRAVENOUS
  Filled 2017-11-12: qty 4000

## 2017-11-12 MED ORDER — ATORVASTATIN CALCIUM 80 MG PO TABS
80.0000 mg | ORAL_TABLET | Freq: Every day | ORAL | Status: DC
  Administered 2017-11-13 – 2017-11-14 (×2): 80 mg via ORAL
  Filled 2017-11-12 (×2): qty 1

## 2017-11-12 MED ORDER — HEPARIN (PORCINE) IN NACL 100-0.45 UNIT/ML-% IJ SOLN
1300.0000 [IU]/h | INTRAMUSCULAR | Status: DC
  Administered 2017-11-12 – 2017-11-14 (×3): 1300 [IU]/h via INTRAVENOUS
  Filled 2017-11-12 (×3): qty 250

## 2017-11-12 MED ORDER — ONDANSETRON HCL 4 MG/2ML IJ SOLN: 4.0000 mg | Freq: Four times a day (QID) | INTRAMUSCULAR | Status: DC | PRN

## 2017-11-12 MED ORDER — SODIUM CHLORIDE 0.9% FLUSH: 3.0000 mL | INTRAVENOUS | Status: DC | PRN

## 2017-11-12 MED ORDER — SODIUM CHLORIDE 0.9% FLUSH: 3.0000 mL | Freq: Two times a day (BID) | INTRAVENOUS | Status: DC

## 2017-11-12 MED ORDER — FAMOTIDINE 20 MG PO TABS
20.0000 mg | ORAL_TABLET | Freq: Two times a day (BID) | ORAL | Status: DC
  Administered 2017-11-12 – 2017-11-15 (×6): 20 mg via ORAL
  Filled 2017-11-12 (×7): qty 1

## 2017-11-12 MED ORDER — ASPIRIN EC 81 MG PO TBEC
81.0000 mg | DELAYED_RELEASE_TABLET | Freq: Every day | ORAL | Status: DC
Start: 2017-11-13 — End: 2017-11-15
  Administered 2017-11-13 – 2017-11-15 (×2): 81 mg via ORAL
  Filled 2017-11-12 (×2): qty 1

## 2017-11-12 MED ORDER — NITROGLYCERIN IN D5W 200-5 MCG/ML-% IV SOLN: 0.0000 ug/min | INTRAVENOUS | Status: DC

## 2017-11-12 MED ORDER — SODIUM CHLORIDE 0.9 % IV SOLN: 250.0000 mL | INTRAVENOUS | Status: DC | PRN

## 2017-11-12 MED ORDER — METOPROLOL TARTRATE 25 MG PO TABS
25.0000 mg | ORAL_TABLET | Freq: Two times a day (BID) | ORAL | Status: DC
  Administered 2017-11-12 – 2017-11-15 (×6): 25 mg via ORAL
  Filled 2017-11-12 (×6): qty 1

## 2017-11-12 NOTE — ED Triage Notes (Signed)
Pt c/o chest pain for 3-4 days, more with exertion, did ease off with advil, Hx of CABG  In past, pt states pain approx 1-2 at present.

## 2017-11-12 NOTE — ED Provider Notes (Signed)
Medical screening examination/treatment/procedure(s) were conducted as a shared visit with non-physician practitioner(s) and myself.  I personally evaluated the patient during the encounter.  EKG Interpretation  Date/Time:  Saturday Nov 12 2017 13:25:38 EDT Ventricular Rate:  73 PR Interval:  160 QRS Duration: 128 QT Interval:  406 QTC Calculation: 447 R Axis:   -100 Text Interpretation:  Normal sinus rhythm Right bundle branch block Possible Lateral infarct , age undetermined Inferior infarct , age undetermined Abnormal ECG t wave changes new since last tracing Confirmed by Dorie Rank [id number]) on 11/12/2017 1:55:01 PM  Patient presented to the emergency room for evaluation of recurrent chest pain.  Patient has a known history of coronary artery disease.  Symptoms started a few days ago.  Patient states currently he is not having any pain or discomfort.  EKG shows some new inverted T waves.  His troponin is elevated at 5.4.  Patient's presentation is concerning for non-ST elevation MI.  Plan on heparin infusion, aspirin and cardiology consultation for admission and further treatment.   Dorie Rank, MD 11/12/17 1426

## 2017-11-12 NOTE — ED Provider Notes (Signed)
[facility] EMERGENCY DEPARTMENT Provider Note   CSN: [id number] Arrival date & time: 11/12/17  1300     History   Chief Complaint Chief Complaint  Patient presents with  . Chest Pain    HPI Jeremy [name] is a 67 y.o. male with past medical history significant for CAD, status post CABG x5, non-STEMI, hypertension, hyperlipidemia presenting with 2 days of intermittent chest pain on exertion with associated shortness of breath.  Reports initially feeling the chest tightness while on his lawnmower 2 days ago and thought it may be due to the pollen and allergies.  His pain was improved significantly after Advil.  He denies any radiation of the pain but does report left forearm associated with his chest pain.  He states that he experienced that again today and thought he should probably get checked out.  He explains that it does not feel the way it did when he had his non-STEMI in the past.  He denies any chest pain at this time. Denies any nausea, vomiting, fever, cough, chills.   HPI  Past Medical History:  Diagnosis Date  . CAD in native artery Sept 2006   Prox LAD, OM3 100%, RCA-->CABG  . HLD (hyperlipidemia)   . HTN (hypertension), benign   . Non-ST elevation myocardial infarction (NSTEMI), subendocardial infarction [facility]) Sept 2006  . Obesity (BMI 30.0-34.9)   . 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 SEMI (subendocardial myocardial infarction) ([place]) 11/12/2017  . Polycythemia 09/15/2013  . Atherosclerotic heart disease of native coronary artery without angina pectoris 09/13/2013  . S/P CABG x 5 09/13/2013  . Dyslipidemia, goal LDL below 70 09/13/2013  . Essential hypertension 09/13/2013  . Obesity (BMI 30-39.9) 09/13/2013    Class: Chronic    Past Surgical History:  Procedure Laterality Date  . APPENDECTOMY    . CARDIAC CATHETERIZATION  Sept 7, 2006   requiring  CABG; (pLAD 90&95%, mid 75%, D1 & D2 - patent; Cx-OM1 ok, OM2 100%, distal Cx mild disease; RCA 50-60% mid, 75% distal)  . CORONARY ARTERY BYPASS GRAFT  SEPT 2006   CABGX 5: fr LIMA-LAD, fr RIMA-OM1, SVG-D1 , seq SVG-PDA/PLA   . DOPPLER ECHOCARDIOGRAPHY  NOV 2012   EF 50 TO 55%,NO WALL MOTION ABNORM   . HERNIA REPAIR    . NM [facility] SINGLE W/SPECT  Oct 2011   LOW RISK: and no significant ischemia  . TONSILLECTOMY          Home Medications    Prior to Admission medications   Medication Sig Start Date End Date Taking? Authorizing Provider  ibuprofen (ADVIL,MOTRIN) 200 MG tablet Take 200-400 mg by mouth every 6 (six) hours as needed (for pain or headaches).     [provider]    Family History Family History  Problem Relation Age of Onset  . Heart attack Mother 5  . Heart attack Father 16    Social History Social History   Tobacco Use  . Smoking status: Never Smoker  . Smokeless tobacco: Never Used  Substance Use Topics  . Alcohol use: No  . Drug use: No     Allergies   Patient has no known allergies.   Review of Systems Review of Systems  Constitutional: Negative for chills, diaphoresis, fatigue and fever.  HENT: Negative for congestion, sore throat, tinnitus and trouble swallowing.   Eyes: Negative for visual  disturbance.  Respiratory: Positive for chest tightness and shortness of breath. Negative for cough, wheezing and stridor.        Symptoms present on exertion  Cardiovascular: Negative for chest pain, palpitations and leg swelling.  Gastrointestinal: Negative for abdominal distention, abdominal pain, nausea and vomiting.  Genitourinary: Negative for dysuria and hematuria.  Musculoskeletal: Negative for arthralgias, back pain, myalgias, neck pain and neck stiffness.  Skin: Negative for color change, pallor and rash.  Neurological: Negative for dizziness, seizures, syncope, light-headedness and headaches.     Physical Exam Updated Vital  Signs BP (!) 140/93   Pulse 76   Temp 98.8 F (37.1 C) (Oral)   Resp 14   Ht 5\' 11"  (1.803 m)   Wt 111.1 kg (245 lb)   SpO2 97%   BMI 34.17 kg/m   Physical Exam  Constitutional: He is oriented to person, place, and time. He appears well-developed and well-nourished.  Non-toxic appearance. He does not appear ill. No distress.  Afebrile, nontoxic-appearing, sitting comfortably in bed no acute distress.  HENT:  Head: Normocephalic and atraumatic.  Eyes: Conjunctivae and EOM are normal.  Neck: Normal range of motion. Neck supple. No JVD present.  No JVD  Cardiovascular: Normal rate, regular rhythm, intact distal pulses and normal pulses. Exam reveals no S3 and no S4.  No murmur heard. Pulmonary/Chest: Effort normal and breath sounds normal. No accessory muscle usage or stridor. No tachypnea. No respiratory distress. He has no decreased breath sounds. He has no wheezes. He has no rhonchi. He has no rales.  Patient is speaking in full sentences without increased work of breathing.  Abdominal: Soft. He exhibits no distension. There is no tenderness. There is no guarding.  Musculoskeletal: Normal range of motion. He exhibits no edema.       Right lower leg: Normal. He exhibits no tenderness and no edema.       Left lower leg: Normal. He exhibits no tenderness and no edema.  Neurological: He is alert and oriented to person, place, and time.  Skin: Skin is warm and dry. No ecchymosis and no rash noted. He is not diaphoretic. No cyanosis or erythema. No pallor.  Psychiatric: He has a normal mood and affect.  Nursing note and vitals reviewed.    ED Treatments / Results  Labs (all labs ordered are listed, but only abnormal results are displayed) Labs Reviewed  CBC - Abnormal; Notable for the following components:      Result Value   WBC 11.5 (*)    All other components within normal limits  I-STAT TROPONIN, ED - Abnormal; Notable for the following components:   Troponin i, poc 5.40 (*)     All other components within normal limits  BASIC METABOLIC PANEL  HEPARIN LEVEL (UNFRACTIONATED)  CBG MONITORING, ED    EKG EKG Interpretation  Date/Time:  Saturday Nov 12 2017 13:25:38 EDT Ventricular Rate:  73 PR Interval:  160 QRS Duration: 128 QT Interval:  406 QTC Calculation: 447 R Axis:   -100 Text Interpretation:  Normal sinus rhythm Right bundle branch block Possible Lateral infarct , age undetermined Inferior infarct , age undetermined Abnormal ECG t wave changes new since last tracing Confirmed by Dorie Rank [phone]) on 11/12/2017 1:55:01 PM   Radiology Dg Chest 2 View  Result Date: 11/12/2017 CLINICAL DATA:  Chest pain for several days. EXAM: CHEST - 2 VIEW COMPARISON:  04/26/2016; 02/11/2015; 11/24/2012; CT abdomen and pelvis - 08/05/2014 FINDINGS: Grossly unchanged cardiac silhouette and mediastinal contours post  median sternotomy and CABG. There is persistent obscuration of the right heart border secondary to known prominent epicardial fat pad as demonstrated on remote abdominal CT performed 08/2014. No focal airspace opacities. No pleural effusion or pneumothorax. No evidence of edema. No acute osseus abnormalities. Stigmata of DISH within the thoracic spine. Post cholecystectomy. IMPRESSION: No acute cardiopulmonary disease. Electronically Signed   By: Sandi Mariscal M.D.   On: 11/12/2017 14:42    Procedures Procedures (including critical care time)  CRITICAL CARE Performed by: Emeline General   Total critical care time: 35 minutes  Critical care time was exclusive of separately billable procedures and treating other patients.  Critical care was necessary to treat or prevent imminent or life-threatening deterioration.  Critical care was time spent personally by me on the following activities: development of treatment plan with patient and/or surrogate as well as nursing, discussions with consultants, evaluation of patient's response to treatment, examination  of patient, obtaining history from patient or surrogate, ordering and performing treatments and interventions, ordering and review of laboratory studies, ordering and review of radiographic studies, pulse oximetry and re-evaluation of patient's condition.   Medications Ordered in ED Medications  heparin ADULT infusion 100 units/mL (25000 units/268mL sodium chloride 0.45%) (1,300 Units/hr Intravenous New Bag/Given 11/12/17 1524)  aspirin chewable tablet 324 mg (324 mg Oral Given 11/12/17 1448)  heparin bolus via infusion 4,000 Units (4,000 Units Intravenous Bolus from Bag 11/12/17 1523)     Initial Impression / Assessment and Plan / ED Course  I have reviewed the triage vital signs and the nursing notes.  Pertinent labs & imaging results that were available during my care of the patient were reviewed by me and considered in my medical decision making (see chart for details).     Patient presenting with intermittent chest tightness with associated shortness of breath on exertion over the last 2 days. T wave inversion changes on EKG from prior tracing.  Initial troponin 5.4 Patient presenting with non-STEMI.  Strong and equal distal pulses, no JVD, no lower extremity edema.  Lungs are clear to auscultation bilaterally.  Negative hepatojugular reflux.  Patient will be given aspirin and heparin per pharmacy consult. Consult placed to cardiology Spoke to Dr. Domenic Polite who will be coming to evaluate patient.  Patient will be admitted to Cardiology.  Final Clinical Impressions(s) / ED Diagnoses   Final diagnoses:  NSTEMI (non-ST elevated myocardial infarction) [facility])    ED Discharge Orders    None       Dossie Der 11/12/17 1554    Dorie Rank, MD 11/13/17 1239

## 2017-11-12 NOTE — ED Notes (Signed)
Cards Provider at bedside. 

## 2017-11-12 NOTE — Progress Notes (Signed)
ANTICOAGULATION CONSULT NOTE - [facility] for Heparin Indication: chest pain/ACS  No Known Allergies  Patient Measurements: Height: 5\' 11"  (180.3 cm) Weight: 242 lb 8 oz (110 kg) IBW/kg (Calculated) : 75.3 Heparin Dosing Weight: 99.2 kg  Vital Signs: Temp: 98.3 F (36.8 C) (05/11 2116) Temp Source: Oral (05/11 2116) BP: 150/99 (05/11 2116) Pulse Rate: 81 (05/11 2116)  Labs: Recent Labs    11/12/17 1329 11/12/17 1921 11/12/17 2116  HGB 17.0  --   --   HCT 50.3  --   --   PLT 205  --   --   HEPARINUNFRC  --   --  0.38  CREATININE 1.16  --   --   TROPONINI  --  6.14*  --     Estimated Creatinine Clearance: 80.1 mL/min (by C-G formula based on SCr of 1.16 mg/dL).  Assessment: 67 year old male in ED with chest pain/NSTEMI to start IV Heparin per pharmacy consult. Patient was not on anticoagulation prior to admission.   Heparin level this evening is therapeutic (HL 0.38, goal of 0.3-0.7). No bleeding or issues noted at this time.   Goal of Therapy:  Heparin level 0.3-0.7 units/ml Monitor platelets by anticoagulation protocol: Yes   Plan:  Continue Heparin at 1300 units/hr.  Will continue to monitor for any signs/symptoms of bleeding and will follow up with heparin level with AM labs to confirm therapeutic  Thank you for allowing pharmacy to be a part of this patient's care.  Alycia Rossetti, PharmD, BCPS Clinical Pharmacist Pager: [phone] 11/12/2017 10:08 PM

## 2017-11-12 NOTE — Progress Notes (Signed)
Received to 3A45 via stretcher from ED. Heparin gtt infusing at 13 ml/hr via PIV. Oriented to room/unit. C/O midsternal CP 2/10. Refuses NTG, states his pain is not that bad, and he states he doesn't want a headache. Pt states that he takes Zantac at home which is not ordered at this time.

## 2017-11-12 NOTE — H&P (Signed)
Physician History and Physical     Patient ID: Jeremy [name] MRN: [id number] DOB/AGE: 09-Aug-1950 67 y.o. Admit date: 11/12/2017  Primary Care Physician: Street, [name], MD Primary Cardiologist: Ellyn Hack  Active Problems:   SEMI (subendocardial myocardial infarction) [facility])   HPI:  67 y.o. last seen by Dr Ellyn Hack 05/03/15.  CABG 2006 with LIMA to LAD, Free RIMA to OM1 SVG to D1 and SVG to PDA. Has been following up with his primary in [place] only. He stopped his statin about 2 years ago as his brother had liver cirrhosis he ascribes to statin. Had been fine until 3-4 days ago. Started having SSCP with activity and mowing grass. Associated with weakness and fatigue. No dyspnea palpitations or syncope Has no nitro at home took advil with some relief. In ER troponin 5.4 with inferolateral T wave changes with less than 1 mm ST elevation in lead 3 does not meet criteria for acute STEMI. Pain free now. CRF;s include HTN , HLD   Review of systems complete and found to be negative unless listed above   Past Medical History:  Diagnosis Date  . CAD in native artery Sept 2006   Prox LAD, OM3 100%, RCA-->CABG  . HLD (hyperlipidemia)   . HTN (hypertension), benign   . Non-ST elevation myocardial infarction (NSTEMI), subendocardial infarction [facility]) Sept 2006  . Obesity (BMI 30.0-34.9)   . 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 Heart attack Mother 61  . Heart attack Father 11    Social History   Socioeconomic History  . Marital status: Single    Spouse name: Not on file  . Number of children: Not on file  . Years of education: Not on file  . Highest education level: Not on file  Occupational History  . Not on file  Social Needs  . Financial resource strain: Not on file  . Food insecurity:    Worry: Not on file    Inability: Not on file  . Transportation needs:    Medical: Not on file   Non-medical: Not on file  Tobacco Use  . Smoking status: Never Smoker  . Smokeless tobacco: Never Used  Substance and Sexual Activity  . Alcohol use: No  . Drug use: No  . Sexual activity: Not on file  Lifestyle  . Physical activity:    Days per week: Not on file    Minutes per session: Not on file  . Stress: Not on file  Relationships  . Social connections:    Talks on phone: Not on file    Gets together: Not on file    Attends religious service: Not on file    Active member of club or organization: Not on file    Attends meetings of clubs or organizations: Not on file    Relationship status: Not on file  . Intimate partner violence:    Fear of current or ex partner: Not on file    Emotionally abused: Not on file    Physically abused: Not on file    Forced sexual activity: Not on file  Other Topics Concern  . Not on file  Social History Narrative   Married father of one. 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  Works full-time at [employer].(12 hr shifts)   Never smoked.     Walks daily for ~30 min/day.      Past Surgical  History:  Procedure Laterality Date  . APPENDECTOMY    . CARDIAC CATHETERIZATION  Sept 7, 2006   requiring CABG; (pLAD 90&95%, mid 75%, D1 & D2 - patent; Cx-OM1 ok, OM2 100%, distal Cx mild disease; RCA 50-60% mid, 75% distal)  . CORONARY ARTERY BYPASS GRAFT  SEPT 2006   CABGX 5: fr LIMA-LAD, fr RIMA-OM1, SVG-D1 , seq SVG-PDA/PLA   . DOPPLER ECHOCARDIOGRAPHY  NOV 2012   EF 50 TO 55%,NO WALL MOTION ABNORM   . HERNIA REPAIR    . NM [facility] SINGLE W/SPECT  Oct 2011   LOW RISK: and no significant ischemia  . TONSILLECTOMY        (Not in a hospital admission)  Physical Exam: Blood pressure (!) 140/93, pulse 76, temperature 98.8 F (37.1 C), temperature source Oral, resp. rate 14, height 5\' 11"  (1.803 m), weight 245 lb (111.1 kg), SpO2 97 %.   Affect appropriate Overweight white male  HEENT: normal Neck supple with no adenopathy JVP  normal no bruits no thyromegaly Lungs clear with no wheezing and good diaphragmatic motion Heart:  S1/S2 no murmur, no rub, gallop or click PMI normal Abdomen: benighn, BS positve, no tenderness, no AAA no bruit.  No HSM or HJR Distal pulses intact with no bruits No edema Neuro non-focal Skin warm and dry No muscular weakness  No current facility-administered medications on file prior to encounter.    Current Outpatient Medications on File Prior to Encounter  Medication Sig Dispense Refill  . ibuprofen (ADVIL,MOTRIN) 200 MG tablet Take 200-400 mg by mouth every 6 (six) hours as needed (for pain or headaches).       Labs:   Lab Results  Component Value Date   WBC 11.5 (H) 11/12/2017   HGB 17.0 11/12/2017   HCT 50.3 11/12/2017   MCV 94.9 11/12/2017   PLT 205 11/12/2017   Recent Labs  Lab 11/12/17 1329  NA 140  K 4.5  CL 103  CO2 28  BUN 12  CREATININE 1.16  CALCIUM 9.5  GLUCOSE 94   No results found for: CKTOTAL, CKMB, CKMBINDEX, TROPONINI   Lab Results  Component Value Date   CHOL 157 04/16/2015   CHOL 162 04/23/2014   CHOL 144 09/07/2013   Lab Results  Component Value Date   HDL 50 04/16/2015   HDL 55 04/23/2014   HDL 55 09/07/2013   Lab Results  Component Value Date   LDLCALC 77 04/16/2015   LDLCALC 81 04/23/2014   LDLCALC 67 09/07/2013   Lab Results  Component Value Date   TRIG 150 (H) 04/16/2015   TRIG 131 04/23/2014   TRIG 111 09/07/2013   Lab Results  Component Value Date   CHOLHDL 3.1 04/16/2015   CHOLHDL 2.6 09/07/2013   No results found for: LDLDIRECT     Radiology: Dg Chest 2 View  Result Date: 11/12/2017 CLINICAL DATA:  Chest pain for several days. EXAM: CHEST - 2 VIEW COMPARISON:  04/26/2016; 02/11/2015; 11/24/2012; CT abdomen and pelvis - 08/05/2014 FINDINGS: Grossly unchanged cardiac silhouette and mediastinal contours post median sternotomy and CABG. There is persistent obscuration of the right heart border secondary to known  prominent epicardial fat pad as demonstrated on remote abdominal CT performed 08/2014. No focal airspace opacities. No pleural effusion or pneumothorax. No evidence of edema. No acute osseus abnormalities. Stigmata of DISH within the thoracic spine. Post cholecystectomy. IMPRESSION: No acute cardiopulmonary disease. Electronically Signed   By: Sandi Mariscal M.D.   On: 11/12/2017 14:42  EKG:  SR rate 73 inferior lateral T wave inversions with less than 1 mm ST elevation in lead 3 Previous ECG with LAFB   ASSESSMENT AND PLAN:   SEMI:  Suspect he has occluded his SVG to PDA or has high grade stenosis Will start iv nitro and heparin High dose statin and beta blocker Admit to step down. Discussed need for cath on Monday or sooner if pain returns or ECG turns acute. He is willing to proceed and remembers procedure from previous admit for CABG.    HTN:  Will be in iv nitro and starting beta blocker  HLD: restart statin discussed fact that he should not have stopped it on his own and we can follow his LFTls not clear that brothers liver issues related to statin but he was scared Check fasting lipid profile in am but suspect LDL is over 70 Signed: Peter Nishan5/05/2018, 3:31 PM

## 2017-11-12 NOTE — Progress Notes (Signed)
ANTICOAGULATION CONSULT NOTE - Initial Consult  Pharmacy Consult for Heparin Indication: chest pain/ACS  No Known Allergies  Patient Measurements: Height: 5\' 11"  (180.3 cm) Weight: 245 lb (111.1 kg) IBW/kg (Calculated) : 75.3 Heparin Dosing Weight: 99.2 kg  Vital Signs: Temp: 98.8 F (37.1 C) (05/11 1315) Temp Source: Oral (05/11 1315) BP: 149/92 (05/11 1315) Pulse Rate: 74 (05/11 1315)  Labs: Recent Labs    11/12/17 1329  HGB 17.0  HCT 50.3  PLT 205  CREATININE 1.16    Estimated Creatinine Clearance: 80.5 mL/min (by C-G formula based on SCr of 1.16 mg/dL).   Medical History: Past Medical History:  Diagnosis Date  . CAD in native artery Sept 2006   Prox LAD, OM3 100%, RCA-->CABG  . HLD (hyperlipidemia)   . HTN (hypertension), benign   . Non-ST elevation myocardial infarction (NSTEMI), subendocardial infarction [facility]) Sept 2006  . Obesity (BMI 30.0-34.9)   . 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 Patient was not on anticoagulation prior to admission.   CBC is within normal limits.  No bleeding noted.  Troponin is elevated at 5.4. SCr is 1.16 with estimated CrCl ~ 80 mL/min.   Goal of Therapy:  Heparin level 0.3-0.7 units/ml Monitor platelets by anticoagulation protocol: Yes   Plan:  Heparin bolus of 4000 units x1. Heparin infusion at 1300 units/hr.  Heparin level in 6 hours.  Daily heparin level and CBC while on therapy.   Sloan Leiter, PharmD, BCPS, BCCCP Clinical Pharmacist Clinical phone 11/12/2017 until 3:30PM [phone] After hours, please call [phone] 11/12/2017,2:33 PM

## 2017-11-12 NOTE — Plan of Care (Signed)
  Problem: Education: Goal: Knowledge of General Education information will improve Outcome: Progressing   Problem: Clinical Measurements: Goal: Ability to maintain clinical measurements within normal limits will improve Outcome: Progressing   Problem: Pain Managment: Goal: General experience of comfort will improve Outcome: Not Progressing

## 2017-11-12 NOTE — ED Notes (Signed)
CBG done at 14:47 was 87.

## 2017-11-13 LAB — CBC
HEMATOCRIT: 47 % (ref 39.0–52.0)
Hemoglobin: 15.7 g/dL (ref 13.0–17.0)
MCH: 31.5 pg (ref 26.0–34.0)
MCHC: 33.4 g/dL (ref 30.0–36.0)
MCV: 94.4 fL (ref 78.0–100.0)
PLATELETS: 178 10*3/uL (ref 150–400)
RBC: 4.98 MIL/uL (ref 4.22–5.81)
RDW: 13 % (ref 11.5–15.5)
WBC: 10.4 10*3/uL (ref 4.0–10.5)

## 2017-11-13 LAB — TROPONIN I
Troponin I: 6.84 ng/mL (ref <0.03)
Troponin I: 5.97 ng/mL (ref <0.03)

## 2017-11-13 LAB — HEPARIN LEVEL (UNFRACTIONATED): Heparin Unfractionated: 0.52 IU/mL (ref 0.30–0.70)

## 2017-11-13 LAB — HIV ANTIBODY (ROUTINE TESTING W REFLEX): HIV Screen 4th Generation wRfx: NONREACTIVE

## 2017-11-13 LAB — LIPID PANEL
CHOL/HDL RATIO: 3 ratio
CHOLESTEROL: 181 mg/dL (ref 0–200)
HDL: 60 mg/dL (ref >40)
LDL CALC: 108 mg/dL — AB (ref 0–99)
TRIGLYCERIDES: 63 mg/dL (ref <150)
VLDL: 13 mg/dL (ref 0–40)

## 2017-11-13 MED ORDER — PANTOPRAZOLE SODIUM 40 MG PO TBEC
40.0000 mg | DELAYED_RELEASE_TABLET | Freq: Every day | ORAL | Status: DC
  Administered 2017-11-13 – 2017-11-15 (×3): 40 mg via ORAL
  Filled 2017-11-13 (×3): qty 1

## 2017-11-13 MED ORDER — SODIUM CHLORIDE 0.9 % WEIGHT BASED INFUSION: 1.0000 mL/kg/h | INTRAVENOUS | Status: DC

## 2017-11-13 MED ORDER — ASPIRIN 81 MG PO CHEW
81.0000 mg | CHEWABLE_TABLET | ORAL | Status: AC
  Administered 2017-11-14: 81 mg via ORAL
  Filled 2017-11-13: qty 1

## 2017-11-13 MED ORDER — SODIUM CHLORIDE 0.9% FLUSH: 3.0000 mL | INTRAVENOUS | Status: DC | PRN

## 2017-11-13 MED ORDER — SODIUM CHLORIDE 0.9% FLUSH: 3.0000 mL | Freq: Two times a day (BID) | INTRAVENOUS | Status: DC

## 2017-11-13 MED ORDER — SODIUM CHLORIDE 0.9 % IV SOLN: 250.0000 mL | INTRAVENOUS | Status: DC | PRN

## 2017-11-13 MED ORDER — SODIUM CHLORIDE 0.9 % WEIGHT BASED INFUSION
3.0000 mL/kg/h | INTRAVENOUS | Status: DC
  Administered 2017-11-14: 3 mL/kg/h via INTRAVENOUS

## 2017-11-13 NOTE — Progress Notes (Signed)
Subjective:  Denies SSCP, palpitations or Dyspnea Some GERD helped with H-2 blocker   Objective:  Vitals:   11/13/17 0019 11/13/17 0443 11/13/17 0444 11/13/17 0813  BP: 129/85 129/84  132/80  Pulse: 74 71  69  Resp:      Temp: 99.3 F (37.4 C) 98.7 F (37.1 C)  98.6 F (37 C)  TempSrc: Oral Oral  Oral  SpO2: 94% 96%  95%  Weight:   243 lb 11.2 oz (110.5 kg)   Height:        Intake/Output from previous day:  Intake/Output Summary (Last 24 hours) at 11/13/2017 8144 Last data filed at 11/13/2017 0700 Gross per 24 hour  Intake 322.8 ml  Output -  Net 322.8 ml    Physical Exam: Affect appropriate Overweight white male  HEENT: normal Neck supple with no adenopathy JVP normal no bruits no thyromegaly Lungs clear with no wheezing and good diaphragmatic motion Heart:  S1/S2 no murmur, no rub, gallop or click PMI normal Abdomen: benighn, BS positve, no tenderness, no AAA no bruit.  No HSM or HJR Distal pulses intact with no bruits No edema Neuro non-focal Skin warm and dry No muscular weakness   Lab Results: Basic Metabolic Panel: Recent Labs    11/12/17 1329  NA 140  K 4.5  CL 103  CO2 28  GLUCOSE 94  BUN 12  CREATININE 1.16  CALCIUM 9.5   Liver Function Tests: No results for input(s): AST, ALT, ALKPHOS, BILITOT, PROT, ALBUMIN in the last 72 hours. No results for input(s): LIPASE, AMYLASE in the last 72 hours. CBC: Recent Labs    11/12/17 1329 11/13/17 0701  WBC 11.5* 10.4  HGB 17.0 15.7  HCT 50.3 47.0  MCV 94.9 94.4  PLT 205 178   Cardiac Enzymes: Recent Labs    11/12/17 1921 11/13/17 0043 11/13/17 0701  TROPONINI 6.14* 6.84* 5.97*   BNP: Invalid input(s): POCBNP D-Dimer: No results for input(s): DDIMER in the last 72 hours. Hemoglobin A1C: No results for input(s): HGBA1C in the last 72 hours. Fasting Lipid Panel: Recent Labs    11/13/17 0043  CHOL 181  HDL 60  LDLCALC 108*  TRIG 63  CHOLHDL 3.0    Imaging: Dg Chest 2  View  Result Date: 11/12/2017 CLINICAL DATA:  Chest pain for several days. EXAM: CHEST - 2 VIEW COMPARISON:  04/26/2016; 02/11/2015; 11/24/2012; CT abdomen and pelvis - 08/05/2014 FINDINGS: Grossly unchanged cardiac silhouette and mediastinal contours post median sternotomy and CABG. There is persistent obscuration of the right heart border secondary to known prominent epicardial fat pad as demonstrated on remote abdominal CT performed 08/2014. No focal airspace opacities. No pleural effusion or pneumothorax. No evidence of edema. No acute osseus abnormalities. Stigmata of DISH within the thoracic spine. Post cholecystectomy. IMPRESSION: No acute cardiopulmonary disease. Electronically Signed   By: Sandi Mariscal M.D.   On: 11/12/2017 14:42    Cardiac Studies:  ECG: SR inferolateral ST changes no ST elevation    Telemetry:  NSR no VT 11/13/2017   Echo: none   Medications:   . aspirin EC  81 mg Oral Daily  . atorvastatin  80 mg Oral q1800  . famotidine  20 mg Oral BID  . metoprolol tartrate  25 mg Oral BID  . pantoprazole  40 mg Oral Daily  . sodium chloride flush  3 mL Intravenous Q12H     . sodium chloride    . heparin 1,300 Units/hr (11/13/17 0530)  . nitroGLYCERIN  Assessment/Plan:   CAD/CABG:  With SEMI probably from SVG to RCA  CABG 2006 with LIMA to LAD Free RIMA to OM1 SVG to D1 And SVG to RCA.  ECG suggests inferolateral ischemia. No murmur on exam pain free on heparin did not want To start nitro due to history of headaches Continue ASA and beta blocker For cath in am Put on board and orders Written.    GERD:  protonix   HLD:  Had been off statin for 2 years He indicated brother had cirrhosis from it lipid profile pending this am    Jenkins Rouge 11/13/2017, 8:26 AM

## 2017-11-13 NOTE — Progress Notes (Addendum)
ANTICOAGULATION CONSULT NOTE - Follow Up Consult  Pharmacy Consult for heparin Indication: chest pain/ACS  No Known Allergies  Patient Measurements: Height: 5\' 11"  (180.3 cm) Weight: 243 lb 11.2 oz (110.5 kg) IBW/kg (Calculated) : 75.3 Heparin Dosing Weight: 99.2 kg  Vital Signs: Temp: 98.6 F (37 C) (05/12 0813) Temp Source: Oral (05/12 0813) BP: 132/80 (05/12 0813) Pulse Rate: 69 (05/12 0813)  Labs: Recent Labs    11/12/17 1329 11/12/17 1921 11/12/17 2116 11/13/17 0043 11/13/17 0701  HGB 17.0  --   --   --  15.7  HCT 50.3  --   --   --  47.0  PLT 205  --   --   --  178  HEPARINUNFRC  --   --  0.38 0.52  --   CREATININE 1.16  --   --   --   --   TROPONINI  --  6.14*  --  6.84* 5.97*    Estimated Creatinine Clearance: 80.3 mL/min (by C-G formula based on SCr of 1.16 mg/dL).   Medications:  Scheduled:  . aspirin EC  81 mg Oral Daily  . atorvastatin  80 mg Oral q1800  . famotidine  20 mg Oral BID  . metoprolol tartrate  25 mg Oral BID  . pantoprazole  40 mg Oral Daily  . sodium chloride flush  3 mL Intravenous Q12H   Infusions:  . sodium chloride    . heparin 1,300 Units/hr (11/13/17 0530)  . nitroGLYCERIN      Assessment: 24 YOM admitted with chest pain/NSTEMI continues on IV heparin. Confirmatory heparin level this morning remains at goal 0.52. Per cardiology planning cath tomorrow morning. CBC is wnl, no bleeding noted.  Goal of Therapy:  Heparin level 0.3-0.7 units/ml Monitor platelets by anticoagulation protocol: Yes   Plan:  Continue heparin at 1300 units/hr Daily heparin level and CBC while on therapy Follow up cath plans/results   Charlene Brooke, PharmD PGY1 Pharmacy Resident Phone: [phone] After 3:30PM please call [place] [phone] 11/13/2017,9:19 AM

## 2017-11-13 NOTE — Progress Notes (Signed)
Dr Hassell Done was made aware of trop 6.14. No new orders.

## 2017-11-13 NOTE — H&P (View-Only) (Signed)
Subjective:  Denies SSCP, palpitations or Dyspnea Some GERD helped with H-2 blocker   Objective:  Vitals:   11/13/17 0019 11/13/17 0443 11/13/17 0444 11/13/17 0813  BP: 129/85 129/84  132/80  Pulse: 74 71  69  Resp:      Temp: 99.3 F (37.4 C) 98.7 F (37.1 C)  98.6 F (37 C)  TempSrc: Oral Oral  Oral  SpO2: 94% 96%  95%  Weight:   243 lb 11.2 oz (110.5 kg)   Height:        Intake/Output from previous day:  Intake/Output Summary (Last 24 hours) at 11/13/2017 0938 Last data filed at 11/13/2017 0700 Gross per 24 hour  Intake 322.8 ml  Output -  Net 322.8 ml    Physical Exam: Affect appropriate Overweight white male  HEENT: normal Neck supple with no adenopathy JVP normal no bruits no thyromegaly Lungs clear with no wheezing and good diaphragmatic motion Heart:  S1/S2 no murmur, no rub, gallop or click PMI normal Abdomen: benighn, BS positve, no tenderness, no AAA no bruit.  No HSM or HJR Distal pulses intact with no bruits No edema Neuro non-focal Skin warm and dry No muscular weakness   Lab Results: Basic Metabolic Panel: Recent Labs    11/12/17 1329  NA 140  K 4.5  CL 103  CO2 28  GLUCOSE 94  BUN 12  CREATININE 1.16  CALCIUM 9.5   Liver Function Tests: No results for input(s): AST, ALT, ALKPHOS, BILITOT, PROT, ALBUMIN in the last 72 hours. No results for input(s): LIPASE, AMYLASE in the last 72 hours. CBC: Recent Labs    11/12/17 1329 11/13/17 0701  WBC 11.5* 10.4  HGB 17.0 15.7  HCT 50.3 47.0  MCV 94.9 94.4  PLT 205 178   Cardiac Enzymes: Recent Labs    11/12/17 1921 11/13/17 0043 11/13/17 0701  TROPONINI 6.14* 6.84* 5.97*   BNP: Invalid input(s): POCBNP D-Dimer: No results for input(s): DDIMER in the last 72 hours. Hemoglobin A1C: No results for input(s): HGBA1C in the last 72 hours. Fasting Lipid Panel: Recent Labs    11/13/17 0043  CHOL 181  HDL 60  LDLCALC 108*  TRIG 63  CHOLHDL 3.0    Imaging: Dg Chest 2  View  Result Date: 11/12/2017 CLINICAL DATA:  Chest pain for several days. EXAM: CHEST - 2 VIEW COMPARISON:  04/26/2016; 02/11/2015; 11/24/2012; CT abdomen and pelvis - 08/05/2014 FINDINGS: Grossly unchanged cardiac silhouette and mediastinal contours post median sternotomy and CABG. There is persistent obscuration of the right heart border secondary to known prominent epicardial fat pad as demonstrated on remote abdominal CT performed 08/2014. No focal airspace opacities. No pleural effusion or pneumothorax. No evidence of edema. No acute osseus abnormalities. Stigmata of DISH within the thoracic spine. Post cholecystectomy. IMPRESSION: No acute cardiopulmonary disease. Electronically Signed   By: Sandi Mariscal M.D.   On: 11/12/2017 14:42    Cardiac Studies:  ECG: SR inferolateral ST changes no ST elevation    Telemetry:  NSR no VT 11/13/2017   Echo: none   Medications:   . aspirin EC  81 mg Oral Daily  . atorvastatin  80 mg Oral q1800  . famotidine  20 mg Oral BID  . metoprolol tartrate  25 mg Oral BID  . pantoprazole  40 mg Oral Daily  . sodium chloride flush  3 mL Intravenous Q12H     . sodium chloride    . heparin 1,300 Units/hr (11/13/17 0530)  . nitroGLYCERIN  Assessment/Plan:   CAD/CABG:  With SEMI probably from SVG to RCA  CABG 2006 with LIMA to LAD Free RIMA to OM1 SVG to D1 And SVG to RCA.  ECG suggests inferolateral ischemia. No murmur on exam pain free on heparin did not want To start nitro due to history of headaches Continue ASA and beta blocker For cath in am Put on board and orders Written.    GERD:  protonix   HLD:  Had been off statin for 2 years He indicated brother had cirrhosis from it lipid profile pending this am    Jenkins Rouge 11/13/2017, 8:26 AM

## 2017-11-14 ENCOUNTER — Inpatient Hospital Stay (HOSPITAL_COMMUNITY)
Admission: EM | Disposition: A | Discharge status: Home / Self Care | Payer: Self-pay | Source: Home / Self Care | Attending: Cardiovascular Disease

## 2017-11-14 ENCOUNTER — Encounter (HOSPITAL_COMMUNITY): Payer: Self-pay | Admitting: General Practice

## 2017-11-14 ENCOUNTER — Inpatient Hospital Stay (HOSPITAL_COMMUNITY): Payer: PPO

## 2017-11-14 DIAGNOSIS — I2510 Atherosclerotic heart disease of native coronary artery without angina pectoris: Secondary | ICD-10-CM

## 2017-11-14 DIAGNOSIS — I361 Nonrheumatic tricuspid (valve) insufficiency: Secondary | ICD-10-CM

## 2017-11-14 DIAGNOSIS — I214 Non-ST elevation (NSTEMI) myocardial infarction: Secondary | ICD-10-CM

## 2017-11-14 DIAGNOSIS — Z955 Presence of coronary angioplasty implant and graft: Secondary | ICD-10-CM | POA: Insufficient documentation

## 2017-11-14 HISTORY — PX: LEFT HEART CATH AND CORS/GRAFTS ANGIOGRAPHY: CATH118250

## 2017-11-14 HISTORY — PX: CORONARY STENT INTERVENTION: CATH118234

## 2017-11-14 LAB — CBC
HEMATOCRIT: 46.4 % (ref 39.0–52.0)
Hemoglobin: 16.1 g/dL (ref 13.0–17.0)
MCH: 32.7 pg (ref 26.0–34.0)
MCHC: 34.7 g/dL (ref 30.0–36.0)
MCV: 94.3 fL (ref 78.0–100.0)
PLATELETS: 187 10*3/uL (ref 150–400)
RBC: 4.92 MIL/uL (ref 4.22–5.81)
RDW: 13.1 % (ref 11.5–15.5)
WBC: 9.2 10*3/uL (ref 4.0–10.5)

## 2017-11-14 LAB — ECHOCARDIOGRAM COMPLETE
HEIGHTINCHES: 71 in
Weight: 3830.4 oz

## 2017-11-14 LAB — POCT ACTIVATED CLOTTING TIME
ACTIVATED CLOTTING TIME: 698 s
Activated Clotting Time: 802 seconds
Activated Clotting Time: 197 seconds
Activated Clotting Time: 164 seconds

## 2017-11-14 LAB — HEPARIN LEVEL (UNFRACTIONATED): Heparin Unfractionated: 0.25 IU/mL — ABNORMAL LOW (ref 0.30–0.70)

## 2017-11-14 SURGERY — LEFT HEART CATH AND CORS/GRAFTS ANGIOGRAPHY
Anesthesia: LOCAL

## 2017-11-14 MED ORDER — HEPARIN SODIUM (PORCINE) 1000 UNIT/ML IJ SOLN
INTRAMUSCULAR | Status: DC | PRN
  Administered 2017-11-14 (×2): 6000 [IU] via INTRAVENOUS

## 2017-11-14 MED ORDER — SODIUM CHLORIDE 0.9 % IV SOLN
INTRAVENOUS | Status: AC
  Administered 2017-11-14: 75 mL/h via INTRAVENOUS
  Administered 2017-11-14: 13:00:00 via INTRAVENOUS

## 2017-11-14 MED ORDER — SODIUM CHLORIDE 0.9% FLUSH: 3.0000 mL | INTRAVENOUS | Status: DC | PRN

## 2017-11-14 MED ORDER — IOHEXOL 350 MG/ML SOLN
INTRAVENOUS | Status: DC | PRN
  Administered 2017-11-14: 180 mL via INTRA_ARTERIAL

## 2017-11-14 MED ORDER — HYDRALAZINE HCL 20 MG/ML IJ SOLN: 5.0000 mg | INTRAMUSCULAR | Status: AC | PRN

## 2017-11-14 MED ORDER — HEPARIN (PORCINE) IN NACL 2-0.9 UNITS/ML
INTRAMUSCULAR | Status: AC | PRN
  Administered 2017-11-14 (×2): 500 mL via INTRA_ARTERIAL

## 2017-11-14 MED ORDER — LABETALOL HCL 5 MG/ML IV SOLN: 10.0000 mg | INTRAVENOUS | Status: AC | PRN

## 2017-11-14 MED ORDER — SODIUM CHLORIDE 0.9% FLUSH
3.0000 mL | Freq: Two times a day (BID) | INTRAVENOUS | Status: DC
  Administered 2017-11-14 (×2): 3 mL via INTRAVENOUS

## 2017-11-14 MED ORDER — VERAPAMIL HCL 2.5 MG/ML IV SOLN
INTRAVENOUS | Status: DC | PRN
  Administered 2017-11-14: 10 mL via INTRA_ARTERIAL

## 2017-11-14 MED ORDER — LIDOCAINE HCL (PF) 1 % IJ SOLN
INTRAMUSCULAR | Status: DC | PRN
  Administered 2017-11-14: 8 mL
  Administered 2017-11-14: 2 mL

## 2017-11-14 MED ORDER — HEPARIN SODIUM (PORCINE) 1000 UNIT/ML IJ SOLN
INTRAMUSCULAR | Status: AC
  Filled 2017-11-14: qty 1

## 2017-11-14 MED ORDER — PERFLUTREN LIPID MICROSPHERE
INTRAVENOUS | Status: AC
  Filled 2017-11-14: qty 10

## 2017-11-14 MED ORDER — TICAGRELOR 90 MG PO TABS
ORAL_TABLET | ORAL | Status: AC
  Filled 2017-11-14: qty 2

## 2017-11-14 MED ORDER — FENTANYL CITRATE (PF) 100 MCG/2ML IJ SOLN
INTRAMUSCULAR | Status: DC | PRN
  Administered 2017-11-14: 25 ug via INTRAVENOUS
  Administered 2017-11-14: 50 ug via INTRAVENOUS
  Administered 2017-11-14: 25 ug via INTRAVENOUS

## 2017-11-14 MED ORDER — MIDAZOLAM HCL 2 MG/2ML IJ SOLN
INTRAMUSCULAR | Status: AC
  Filled 2017-11-14: qty 2

## 2017-11-14 MED ORDER — VERAPAMIL HCL 2.5 MG/ML IV SOLN
INTRAVENOUS | Status: AC
  Filled 2017-11-14: qty 2

## 2017-11-14 MED ORDER — MIDAZOLAM HCL 2 MG/2ML IJ SOLN
INTRAMUSCULAR | Status: DC | PRN
  Administered 2017-11-14: 2 mg via INTRAVENOUS
  Administered 2017-11-14 (×2): 1 mg via INTRAVENOUS

## 2017-11-14 MED ORDER — LIDOCAINE HCL (PF) 1 % IJ SOLN
INTRAMUSCULAR | Status: AC
  Filled 2017-11-14: qty 30

## 2017-11-14 MED ORDER — SODIUM CHLORIDE 0.9 % IV SOLN: 250.0000 mL | INTRAVENOUS | Status: DC | PRN

## 2017-11-14 MED ORDER — ATROPINE SULFATE 1 MG/10ML IJ SOSY
PREFILLED_SYRINGE | INTRAMUSCULAR | Status: AC
  Filled 2017-11-14: qty 10

## 2017-11-14 MED ORDER — HEPARIN (PORCINE) IN NACL 1000-0.9 UT/500ML-% IV SOLN
INTRAVENOUS | Status: AC
  Filled 2017-11-14: qty 1000

## 2017-11-14 MED ORDER — TICAGRELOR 90 MG PO TABS
90.0000 mg | ORAL_TABLET | Freq: Two times a day (BID) | ORAL | Status: DC
  Administered 2017-11-14 – 2017-11-15 (×2): 90 mg via ORAL
  Filled 2017-11-14 (×2): qty 1

## 2017-11-14 MED ORDER — PERFLUTREN LIPID MICROSPHERE
1.0000 mL | INTRAVENOUS | Status: AC | PRN
  Administered 2017-11-14: 15:00:00 2 mL via INTRAVENOUS
  Filled 2017-11-14: qty 10

## 2017-11-14 MED ORDER — TICAGRELOR 90 MG PO TABS
ORAL_TABLET | ORAL | Status: DC | PRN
  Administered 2017-11-14: 180 mg via ORAL

## 2017-11-14 MED ORDER — NITROGLYCERIN 1 MG/10 ML FOR IR/CATH LAB
INTRA_ARTERIAL | Status: DC | PRN
  Administered 2017-11-14: 200 ug via INTRACORONARY

## 2017-11-14 MED ORDER — FENTANYL CITRATE (PF) 100 MCG/2ML IJ SOLN
INTRAMUSCULAR | Status: AC
  Filled 2017-11-14: qty 2

## 2017-11-14 MED ORDER — NITROGLYCERIN 1 MG/10 ML FOR IR/CATH LAB
INTRA_ARTERIAL | Status: AC
  Filled 2017-11-14: qty 10

## 2017-11-14 SURGICAL SUPPLY — 26 items
BALLN SAPPHIRE 2.5X15 (BALLOONS) ×2
BALLN ~~LOC~~ EMERGE MR 4.5X12 (BALLOONS) ×2
BALLOON SAPPHIRE 2.5X15 (BALLOONS) ×1 IMPLANT
BALLOON ~~LOC~~ EMERGE MR 4.5X12 (BALLOONS) ×1 IMPLANT
CATH INFINITI 5 FR 3DRC (CATHETERS) ×2 IMPLANT
CATH INFINITI 5 FR AR1 MOD (CATHETERS) ×2 IMPLANT
CATH INFINITI 5 FR IM (CATHETERS) ×2 IMPLANT
CATH INFINITI 5 FR RCB (CATHETERS) ×2 IMPLANT
CATH INFINITI 5FR AL1 (CATHETERS) ×2 IMPLANT
CATH INFINITI 5FR MULTPACK ANG (CATHETERS) ×2 IMPLANT
CATH LAUNCHER 6FR AL1 (CATHETERS) ×1 IMPLANT
CATHETER LAUNCHER 6FR AL1 (CATHETERS) ×2
DEVICE RAD COMP TR BAND LRG (VASCULAR PRODUCTS) ×2 IMPLANT
GUIDEWIRE INQWIRE 1.5J.035X260 (WIRE) ×1 IMPLANT
INQWIRE 1.5J .035X260CM (WIRE) ×2
KIT HEART LEFT (KITS) ×2 IMPLANT
NEEDLE PERC 21GX4CM (NEEDLE) ×2 IMPLANT
PACK CARDIAC CATHETERIZATION (CUSTOM PROCEDURE TRAY) ×2 IMPLANT
SHEATH AVANTI 11CM 5FR (SHEATH) ×2 IMPLANT
SHEATH AVANTI 11CM 6FR (SHEATH) ×2 IMPLANT
SHEATH RAIN RADIAL 21G 6FR (SHEATH) ×2 IMPLANT
STENT RESOLUTE ONYX 4.5X26 (Permanent Stent) ×2 IMPLANT
TRANSDUCER W/STOPCOCK (MISCELLANEOUS) ×2 IMPLANT
TUBING CIL FLEX 10 FLL-RA (TUBING) ×2 IMPLANT
WIRE COUGAR XT STRL 190CM (WIRE) ×2 IMPLANT
WIRE EMERALD 3MM-J .035X150CM (WIRE) ×2 IMPLANT

## 2017-11-14 NOTE — Interval H&P Note (Signed)
History and Physical Interval Note:  11/14/2017 7:29 AM  [name]  has presented today for cardiac cath with the diagnosis of NSTEMI  The various methods of treatment have been discussed with the patient and family. After consideration of risks, benefits and other options for treatment, the patient has consented to  Procedure(s): LEFT HEART CATH AND CORS/GRAFTS ANGIOGRAPHY (N/A) as a surgical intervention .  The patient's history has been reviewed, patient examined, no change in status, stable for surgery.  I have reviewed the patient's chart and labs.  Questions were answered to the patient's satisfaction.    Cath Lab Visit (complete for each Cath Lab visit)  Clinical Evaluation Leading to the Procedure:   ACS: Yes.    Non-ACS:    Anginal Classification: CCS III  Anti-ischemic medical therapy: No Therapy  Non-Invasive Test Results: No non-invasive testing performed  Prior CABG: Previous CABG          Jeremy [name]

## 2017-11-14 NOTE — Progress Notes (Signed)
Site area: right groin  Site Prior to Removal:  Level 0  Pressure Applied For 20 MINUTES    Minutes Beginning at 1315  Manual:   Yes.    Patient Status During Pull:  WNL  Post Pull Groin Site:  Level 0  Post Pull Instructions Given:  Yes.    Post Pull Pulses Present:  Yes.    Dressing Applied:  Yes.    Comments:  Tolerated procedure well

## 2017-11-14 NOTE — Consult Note (Signed)
[facility] CM Primary Care Navigator  11/14/2017  Jeremy [name] Nov 05, 1950 [id number]   Met withpatientand wife ([name]) at the bedsideto identify possible discharge needs. Patientreports having"chest tightness" thathad ledto this admission. (subendocardial myocardial infarction)  Wife endorses Dr. Christa See with [place] care provider.   Patient verbalizedusing [facility] and [facility] in [place] obtain medications without difficulty.   Patient reportsmanaginghisownmedications at homestraight out of the containers.  Patient statesthat he has been driving prior to admission but wife will beprovidingtransportation to hisdoctors'appointments after discharge.  Patientlives with wife who will be his primary caregiver at home.  Anticipateddischarge planishomeaccording to patient.  Patientand wife voiced understanding to call primary care provider's officewhen hereturnshomefor a post discharge follow-up within1- 2weeksor sooner if needs arise.Patient letter (with PCP's contact number) was provided asareminder.   Discussed with patientand wife about [facility] CM services available for health management at home buthe denies any needs or concerns for now. Patienthad declinedEMMI calls to follow-upwithrecovery, stating thathe "does not feel the need for services at this time".  Encouraged patient and wifeto seekreferral from primary care provider to [facility] care management if deemednecessaryand appropriate for services in the future.  [facility] care management information was provided for future needs thathe may have.   For additional questions please contact:  Edwena Felty A. Tanyon Alipio, BSN, RN-BC [facility] PRIMARY CARE Navigator Cell: [phone]

## 2017-11-14 NOTE — Progress Notes (Signed)
TRB BAND REMOVAL  LOCATION:    right radial  DEFLATED PER PROTOCOL:    Yes.    TIME BAND OFF / DRESSING APPLIED:    1500   SITE UPON ARRIVAL:    Level 0  SITE AFTER BAND REMOVAL:    Level 0  CIRCULATION SENSATION AND MOVEMENT:    Within Normal Limits   Yes.    COMMENTS:   TOLERATED PROCEDURE WELL

## 2017-11-14 NOTE — Progress Notes (Signed)
  Echocardiogram 2D Echocardiogram has been performed.  Jeremy [name] 11/14/2017, 3:08 PM

## 2017-11-14 NOTE — Progress Notes (Signed)
#   5.  S/W ROBERT @  [facility] # [phone] OPT- 2    BRILINTA  90 MG BID  COVER- YES  CO-PAY- $ 45.00  TIER- 3 DRUG  PRIOR APPROVAL- NO   PREFERRED PHARMACY : YES- [facility] AND  [facility]

## 2017-11-14 NOTE — Plan of Care (Signed)
  Problem: Pain Managment: Goal: General experience of comfort will improve Outcome: Progressing   

## 2017-11-15 ENCOUNTER — Other Ambulatory Visit: Payer: Self-pay | Admitting: Cardiology

## 2017-11-15 ENCOUNTER — Encounter (HOSPITAL_COMMUNITY): Payer: Self-pay | Admitting: Cardiology

## 2017-11-15 DIAGNOSIS — I25709 Atherosclerosis of coronary artery bypass graft(s), unspecified, with unspecified angina pectoris: Secondary | ICD-10-CM

## 2017-11-15 DIAGNOSIS — Z955 Presence of coronary angioplasty implant and graft: Secondary | ICD-10-CM

## 2017-11-15 LAB — BASIC METABOLIC PANEL
ANION GAP: 8 (ref 5–15)
BUN: 16 mg/dL (ref 6–20)
CO2: 25 mmol/L (ref 22–32)
CREATININE: 1.18 mg/dL (ref 0.61–1.24)
Calcium: 8.7 mg/dL — ABNORMAL LOW (ref 8.9–10.3)
Chloride: 105 mmol/L (ref 101–111)
GLUCOSE: 87 mg/dL (ref 65–99)
Potassium: 3.8 mmol/L (ref 3.5–5.1)
Sodium: 138 mmol/L (ref 135–145)

## 2017-11-15 LAB — CBC
HEMATOCRIT: 43.9 % (ref 39.0–52.0)
Hemoglobin: 14.8 g/dL (ref 13.0–17.0)
MCH: 31.7 pg (ref 26.0–34.0)
MCHC: 33.7 g/dL (ref 30.0–36.0)
MCV: 94 fL (ref 78.0–100.0)
PLATELETS: 172 10*3/uL (ref 150–400)
RBC: 4.67 MIL/uL (ref 4.22–5.81)
RDW: 13.1 % (ref 11.5–15.5)
WBC: 8.8 10*3/uL (ref 4.0–10.5)

## 2017-11-15 MED ORDER — METOPROLOL TARTRATE 25 MG PO TABS: 25.0000 mg | ORAL_TABLET | Freq: Two times a day (BID) | ORAL | 2 refills | Status: DC

## 2017-11-15 MED ORDER — TICAGRELOR 90 MG PO TABS: 90.0000 mg | ORAL_TABLET | Freq: Two times a day (BID) | ORAL | 2 refills | Status: DC

## 2017-11-15 MED ORDER — VALSARTAN 40 MG PO TABS: 40.0000 mg | ORAL_TABLET | Freq: Every day | ORAL | 2 refills | Status: DC

## 2017-11-15 MED ORDER — NITROGLYCERIN 0.4 MG SL SUBL: 0.4000 mg | SUBLINGUAL_TABLET | SUBLINGUAL | 1 refills | Status: AC | PRN

## 2017-11-15 MED ORDER — ASPIRIN 81 MG PO TBEC
81.0000 mg | DELAYED_RELEASE_TABLET | Freq: Every day | ORAL | Status: DC
Start: 2017-11-15 — End: 2018-12-26

## 2017-11-15 MED ORDER — ATORVASTATIN CALCIUM 80 MG PO TABS: 80.0000 mg | ORAL_TABLET | Freq: Every day | ORAL | 1 refills | Status: DC

## 2017-11-15 MED ORDER — ANGIOPLASTY BOOK
Freq: Once | Status: AC
  Administered 2017-11-15: 08:00:00
  Filled 2017-11-15: qty 1

## 2017-11-15 NOTE — Discharge Summary (Signed)
Discharge Summary    Patient ID: Jeremy [name],  MRN: [id number], DOB/AGE: 1950/09/23 67 y.o.  Admit date: 11/12/2017 Discharge date: 11/15/2017  Primary Care Provider: Emmaline Kluver Primary Cardiologist: Dr. Ellyn Hack  Discharge Diagnoses    Principal Problem:   NSTEMI (non-ST elevated myocardial infarction) [facility]) Active Problems:   Dyslipidemia, goal LDL below 70   Essential hypertension   SEMI (subendocardial myocardial infarction) ([place])   Allergies No Known Allergies  Diagnostic Studies/Procedures    Cath: 11/14/17  Conclusion     SVG graft was visualized by angiography.  Origin lesion is 100% stenosed.  Mid RCA lesion is 99% stenosed.  Ost RPDA lesion is 99% stenosed.  Post Atrio lesion is 100% stenosed.  Ost Cx to Prox Cx lesion is 40% stenosed.  Ost 1st Mrg lesion is 50% stenosed.  Ost 2nd Mrg lesion is 100% stenosed.  RIMA graft was visualized by angiography.  Origin to Prox Graft lesion is 100% stenosed.  SVG graft was visualized by angiography.  Ost LAD to Prox LAD lesion is 99% stenosed.  LIMA graft was visualized by angiography.  A drug-eluting stent was successfully placed using a STENT RESOLUTE ONYX 4.5X26.  Post intervention, there is a 0% residual stenosis.   1. 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  2. Severe stenosis proximal LAD. The mid and distal vessel fills from antegrade flow and from the patent LIMA graft. The vein graft to the diagonal branch is patent.  3. The circumflex has mild to moderate proximal vessel stenosis. The first obtuse marginal branch is small in caliber and has moderate stenosis. The second obtuse marginal branch is occluded. The vein graft that filled this vessel is now occluded.  4. The RCA is a large dominant artery with a very unusual, high and anterior takeoff. The vessel has a 99% mid stenosis. The distal branches are occluded. The vein graft that had been  sequential to both the PDA and posterolateral artery is now occluded.  5. Successful PTCA/DES x 1 mid RCA 6. Culprit for NSTEMI is felt to be occlusion of the vein graft to the PDA and posterolateral artery  Recommendations: Will continue DAPT with ASA and Brilinta for one year. Continue statin and beta blocker. Echo today. Probable d/c home tomorrow.    TTE: 11/14/17  Study Conclusions  - Left ventricle: The cavity size was normal. There was mild   concentric hypertrophy. Systolic function was mildly to   moderately reduced. The estimated ejection fraction was in the   range of 40% to 45%. Doppler parameters are consistent with   abnormal left ventricular relaxation (grade 1 diastolic   dysfunction). There was no evidence of elevated ventricular   filling pressure by Doppler parameters. - Mitral valve: There was no regurgitation. - Right ventricle: The cavity size was normal. Wall thickness was   normal. Systolic function was normal. - Tricuspid valve: There was mild regurgitation. - Pulmonary arteries: Systolic pressure was mildly increased. PA   peak pressure: 31 mm Hg (S). - Inferior vena cava: The vessel was normal in size.  Impressions:  - There is akinesis of the basal and mid inferior and inferolateral   walls. LVEF 40-45%. RVEF appears normal. _____________   History of Present Illness     67 y.o. last seen by Dr Ellyn Hack 05/03/15.  CABG 2006 with LIMA to LAD, Free RIMA to OM1 SVG to D1 and SVG to PDA. Has been following up with  his primary in [place] only. He stopped his statin about 2 years ago as his brother had liver cirrhosis he ascribes to statin. Had been fine until 3-4 days prior to admission. Started having SSCP with activity and mowing grass. Associated with weakness and fatigue. No dyspnea palpitations or syncope. Had no nitro at home. Took advil with some relief. In ER troponin 5.4 with inferolateral T wave changes with less than 1 mm ST elevation in lead 3  does not meet criteria for acute STEMI. Pain free now. CRF;s include HTN , HLD. Given his symptoms he was admitted with plans for cath.   Hospital Course     Troponin peaked at 6.84. He underwent cardiac cath noted above with 2/4 patent grafts (patent LIMA-LAD, SVG- Diag). Successful PCI/DES x1 to the [facility] for 99% lesion. Plan for DAPT with ASA/Brilinta for at least one year. Follow up echo showed EF of 40-45% with akinesis in the basal and mid inferior and inferolateral walls. No chest pain noted post cath. LDL 108, therefore started on high dose statin. Also added metoprolol and Diovan. Worked well with cardiac rehab.   General: Well developed, well nourished, male appearing in no acute distress. Head: Normocephalic, atraumatic.  Neck: Supple without bruits, JVD. Lungs:  Resp regular and unlabored, CTA. Heart: RRR, S1, S2, no S3, S4, or murmur; no rub. Abdomen: Soft, non-tender, non-distended with normoactive bowel sounds. No hepatomegaly. No rebound/guarding. No obvious abdominal masses. Extremities: No clubbing, cyanosis, edema. Distal pedal pulses are 2+ bilaterally. Left radial/Right femoral cath site stable with bruising no hematoma Neuro: Alert and oriented X 3. Moves all extremities spontaneously. Psych: Normal affect.  Jeremy [name] was seen by Dr. Ellyn Hack and determined stable for discharge home. Follow up in the office has been arranged. Medications are listed below.   _____________  Discharge Vitals Blood pressure 137/85, pulse 62, temperature 98.7 F (37.1 C), temperature source Oral, resp. rate 17, height 5\' 11"  (1.803 m), weight 240 lb 4.8 oz (109 kg), SpO2 97 %.  Filed Weights   11/13/17 0444 11/14/17 0426 11/15/17 0555  Weight: 243 lb 11.2 oz (110.5 kg) 239 lb 6.4 oz (108.6 kg) 240 lb 4.8 oz (109 kg)    Labs & Radiologic Studies    CBC Recent Labs    11/14/17 0450 11/15/17 0213  WBC 9.2 8.8  HGB 16.1 14.8  HCT 46.4 43.9  MCV 94.3 94.0  PLT 187 762   Basic  Metabolic Panel Recent Labs    11/12/17 1329 11/15/17 0213  NA 140 138  K 4.5 3.8  CL 103 105  CO2 28 25  GLUCOSE 94 87  BUN 12 16  CREATININE 1.16 1.18  CALCIUM 9.5 8.7*   Liver Function Tests No results for input(s): AST, ALT, ALKPHOS, BILITOT, PROT, ALBUMIN in the last 72 hours. No results for input(s): LIPASE, AMYLASE in the last 72 hours. Cardiac Enzymes Recent Labs    11/12/17 1921 11/13/17 0043 11/13/17 0701  TROPONINI 6.14* 6.84* 5.97*   BNP Invalid input(s): POCBNP D-Dimer No results for input(s): DDIMER in the last 72 hours. Hemoglobin A1C No results for input(s): HGBA1C in the last 72 hours. Fasting Lipid Panel Recent Labs    11/13/17 0043  CHOL 181  HDL 60  LDLCALC 108*  TRIG 63  CHOLHDL 3.0   Thyroid Function Tests No results for input(s): TSH, T4TOTAL, T3FREE, THYROIDAB in the last 72 hours.  Invalid input(s): FREET3 _____________  Dg Chest 2 View  Result Date: 11/12/2017 CLINICAL  DATA:  Chest pain for several days. EXAM: CHEST - 2 VIEW COMPARISON:  04/26/2016; 02/11/2015; 11/24/2012; CT abdomen and pelvis - 08/05/2014 FINDINGS: Grossly unchanged cardiac silhouette and mediastinal contours post median sternotomy and CABG. There is persistent obscuration of the right heart border secondary to known prominent epicardial fat pad as demonstrated on remote abdominal CT performed 08/2014. No focal airspace opacities. No pleural effusion or pneumothorax. No evidence of edema. No acute osseus abnormalities. Stigmata of DISH within the thoracic spine. Post cholecystectomy. IMPRESSION: No acute cardiopulmonary disease. Electronically Signed   By: Sandi Mariscal M.D.   On: 11/12/2017 14:42   Disposition   Pt is being discharged home today in good condition.  Follow-up Plans & Appointments    Follow-up Information    Lendon Colonel, NP Follow up on 11/24/2017.   Specialties:  Nurse Practitioner, Radiology, Cardiology Why:  at 11:30am for your follow up  appt.  Contact information: [address] [place] [address] [phone]          Discharge Instructions    Diet - low sodium heart healthy   Complete by:  As directed    Discharge instructions   Complete by:  As directed    Groin Site Care Refer to this sheet in the next few weeks. These instructions provide you with information on caring for yourself after your procedure. Your caregiver may also give you more specific instructions. Your treatment has been planned according to current medical practices, but problems sometimes occur. Call your caregiver if you have any problems or questions after your procedure. HOME CARE INSTRUCTIONS You may shower 24 hours after the procedure. Remove the bandage (dressing) and gently wash the site with plain soap and water. Gently pat the site dry.  Do not apply powder or lotion to the site.  Do not sit in a bathtub, swimming pool, or whirlpool for 5 to 7 days.  No bending, squatting, or lifting anything over 10 pounds (4.5 kg) as directed by your caregiver.  Inspect the site at least twice daily.  Do not drive home if you are discharged the same day of the procedure. Have someone else drive you.  You may drive 24 hours after the procedure unless otherwise instructed by your caregiver.  What to expect: Any bruising will usually fade within 1 to 2 weeks.  Blood that collects in the tissue (hematoma) may be painful to the touch. It should usually decrease in size and tenderness within 1 to 2 weeks.  SEEK IMMEDIATE MEDICAL CARE IF: You have unusual pain at the groin site or down the affected leg.  You have redness, warmth, swelling, or pain at the groin site.  You have drainage (other than a small amount of blood on the dressing).  You have chills.  You have a fever or persistent symptoms for more than 72 hours.  You have a fever and your symptoms suddenly get worse.  Your leg becomes pale, cool, tingly, or numb.  You have heavy  bleeding from the site. Hold pressure on the site. [name]  PLEASE DO NOT MISS ANY DOSES OF YOUR BRILINTA!!!!! Also keep a log of you blood pressures and bring back to your follow up appt. Please call the office with any questions.   Patients taking blood thinners should generally stay away from medicines like ibuprofen, Advil, Motrin, naproxen, and Aleve due to risk of stomach bleeding. You may take Tylenol as directed or talk to your primary doctor about alternatives.  Increase activity slowly   Complete by:  As directed       Discharge Medications     Medication List    STOP taking these medications   ibuprofen 200 MG tablet Commonly known as:  ADVIL,MOTRIN     TAKE these medications   aspirin 81 MG EC tablet Take 1 tablet (81 mg total) by mouth daily.   atorvastatin 80 MG tablet Commonly known as:  LIPITOR Take 1 tablet (80 mg total) by mouth daily at 6 PM.   metoprolol tartrate 25 MG tablet Commonly known as:  LOPRESSOR Take 1 tablet (25 mg total) by mouth 2 (two) times daily.   nitroGLYCERIN 0.4 MG SL tablet Commonly known as:  NITROSTAT Place 1 tablet (0.4 mg total) under the tongue every 5 (five) minutes x 3 doses as needed for chest pain.   ranitidine 150 MG tablet Commonly known as:  ZANTAC Take 150 mg by mouth at bedtime.   ticagrelor 90 MG Tabs tablet Commonly known as:  BRILINTA Take 1 tablet (90 mg total) by mouth 2 (two) times daily.   valsartan 40 MG tablet Commonly known as:  DIOVAN Take 1 tablet (40 mg total) by mouth daily.       Aspirin prescribed at discharge?  Yes High Intensity Statin Prescribed? (Lipitor 40-80mg  or Crestor 20-40mg ): Yes Beta Blocker Prescribed? Yes For EF <40%, was ACEI/ARB Prescribed? Yes ADP Receptor Inhibitor Prescribed? (i.e. Plavix etc.-Includes Medically Managed Patients): Yes For EF <40%, Aldosterone Inhibitor Prescribed? No: EF ok Was EF assessed during THIS hospitalization? Yes Was Cardiac Rehab II ordered?  (Included Medically managed Patients): Yes   Outstanding Labs/Studies   FLP/LFTs in 6 weeks. BMET at follow up appt.   Duration of Discharge Encounter   Greater than 30 minutes including physician time.  Signed, Reino Bellis NP-C 11/15/2017, 8:44 AM

## 2017-11-15 NOTE — Progress Notes (Signed)
CARDIAC REHAB PHASE I   PRE:  Rate/Rhythm: 85 SR  BP:  Supine:   Sitting: 125/61  Standing:    SaO2:   MODE:  Ambulation: 500 ft   POST:  Rate/Rhythm: 95 SR  BP:  Supine:   Sitting: 156/65  Standing:    SaO2:  0815-0905 Pt walked 500 ft with steady gait and no CP. Tolerated well. MI education completed with pt and wife who voiced understanding. Stressed importance of brilinta with stent. Wife stated she has brilinta card. Reviewed NTG use,MI restrictions, gave heart healthy diet, ex ed and CRP 2. Referring to [place] Phase 2. Pt has attended before.    Graylon Good, RN BSN  11/15/2017 9:01 AM

## 2017-11-15 NOTE — Care Management Note (Signed)
Case Management Note  Patient Details  Name: Jeremy [name] MRN: [id number] Date of Birth: 09-Jun-1951  Subjective/Objective:  From home with wife, s/p stent intervention, RN Albin Felling gave patient the 30 day savings coupon, NCM informed patient  Wife about his co pay of 45.00 for refills and that his cardiologist should also have samples in the office.                   Action/Plan: DC home when ready.  Expected Discharge Date:  11/15/17               Expected Discharge Plan:  Home/Self Care  In-House Referral:     Discharge planning Services  CM Consult, Medication Assistance  Post Acute Care Choice:    Choice offered to:     DME Arranged:    DME Agency:     HH Arranged:    HH Agency:     Status of Service:  Completed, signed off  If discussed at [employer] of Stay Meetings, dates discussed:    Additional Comments:  Zenon Mayo, RN 11/15/2017, 9:06 AM

## 2017-11-16 MED FILL — Heparin Sod (Porcine)-NaCl IV Soln 1000 Unit/500ML-0.9%: INTRAVENOUS | Qty: 1000 | Status: AC

## 2017-11-18 ENCOUNTER — Encounter (HOSPITAL_COMMUNITY): Payer: Self-pay | Admitting: Cardiovascular Disease

## 2017-11-23 NOTE — Progress Notes (Signed)
Cardiology Office Note   Date:  11/24/2017   ID:  HELEN CUFF, DOB 1951-03-15, MRN [id number]  PCP:  Street, [name], MD  Cardiologist:  Dr. Ellyn Hack  Chief Complaint  Patient presents with  . Follow-up     History of Present Illness: Jeremy [name] is a 67 y.o. male who presents for post hospitalization follow up after admission for NSTEMI, with hx of CAD, CABG, hypertension, and dyslipidemia. Patient underwent cardiac cath revealing severe triple vessel disease s/p CABG with 2/5 patent bypass grafts, requiring intervention with PCI of mid RCA due to 99% RCA. It was felt that the culprit lesion for NSTEMI was occlusion of the VG to the PDA and posterior lateral artery. Patient was to continue DAPT with ASA and Brilinta for one year, continue on statin and BB. Echo was repeated and found to have EF of 40-45% with akinesis of the basal and mid inferior and inferolateral walls.   He is doing well and is without complaints. He denies dyspnea, bleeding or fatigue on new medication regimen. He is medically compliant. He is going to participate in cardiac rehab. He is transitioning to a plant based diet and wants to lose 40 lbs.    Past Medical History:  Diagnosis Date  . CAD in native artery Sept 2006   Prox LAD, OM3 100%, RCA-->CABG  . History of kidney stones   . HLD (hyperlipidemia)   . HTN (hypertension), benign   . Non-ST elevation myocardial infarction (NSTEMI), subendocardial infarction [facility]) Sept 2006  . NSTEMI (non-ST elevated myocardial infarction) ([place])    11/14/17 PCI/DES to mRCA, 2/4 patent grafts (LIMA-LAD, SVG-Diag). EF 40-45%.   . Obesity (BMI 30.0-34.9)   . 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 APPENDECTOMY    . CARDIAC CATHETERIZATION  Sept 7, 2006   requiring CABG; (pLAD 90&95%, mid 75%, D1 & D2 - patent; Cx-OM1 ok, OM2 100%, distal Cx mild disease; RCA 50-60% mid, 75%  distal)  . CORONARY ARTERY BYPASS GRAFT  SEPT 2006   CABGX 5: fr LIMA-LAD, fr RIMA-OM1, SVG-D1 , seq SVG-PDA/PLA   . CORONARY STENT INTERVENTION  11/14/2017  . CORONARY STENT INTERVENTION N/A 11/14/2017   Procedure: CORONARY STENT INTERVENTION;  Surgeon: [name], MD;  Location: [place] [facility];  Service: Cardiovascular;  Laterality: N/A;  . DOPPLER ECHOCARDIOGRAPHY  NOV 2012   EF 50 TO 55%,NO WALL MOTION ABNORM   . HERNIA REPAIR    . LEFT HEART CATH AND CORS/GRAFTS ANGIOGRAPHY N/A 11/14/2017   Procedure: LEFT HEART CATH AND CORS/GRAFTS ANGIOGRAPHY;  Surgeon: [name], MD;  Location: [place] [facility];  Service: Cardiovascular;  Laterality: N/A;  . NM MYOCAR SINGLE W/SPECT  Oct 2011   LOW RISK: and no significant ischemia  . TONSILLECTOMY       Current Outpatient Medications  Medication Sig Dispense Refill  . aspirin EC 81 MG EC tablet Take 1 tablet (81 mg total) by mouth daily.    [name] atorvastatin (LIPITOR) 80 MG tablet Take 1 tablet (80 mg total) by mouth daily at 6 PM. 90 tablet 1  . metoprolol tartrate (LOPRESSOR) 25 MG tablet Take 1 tablet (25 mg total) by mouth 2 (two) times daily. 60 tablet 2  . nitroGLYCERIN (NITROSTAT) 0.4 MG SL tablet Place 1 tablet (0.4 mg total) under the tongue every 5 (five) minutes x 3 doses as needed  for chest pain. 25 tablet 1  . ranitidine (ZANTAC) 150 MG tablet Take 150 mg by mouth at bedtime.    . ticagrelor (BRILINTA) 90 MG TABS tablet Take 1 tablet (90 mg total) by mouth 2 (two) times daily. 180 tablet 2  . valsartan (DIOVAN) 40 MG tablet Take 1 tablet (40 mg total) by mouth daily. 30 tablet 2   No current facility-administered medications for this visit.     Allergies:   Patient has no known allergies.    Social History:  The patient  reports that he has never smoked. He has never used smokeless tobacco. He reports that he does not drink alcohol or use drugs.   Family History:  The patient's family history  includes Heart attack (age of onset: 48) in his mother; Heart attack (age of onset: 98) in his father.    ROS: All other systems are reviewed and negative. Unless otherwise mentioned in H&P    PHYSICAL EXAM: VS:  BP 115/74   Pulse (!) 50   Ht 5\' 11"  (1.803 m)   Wt 234 lb 9.6 oz (106.4 kg)   BMI 32.72 kg/m  , BMI Body mass index is 32.72 kg/m. GEN: Well nourished, well developed, in no acute distress Obese  HEENT: normal  Neck: no JVD, carotid bruits, or masses Cardiac: RRR; no murmurs, rubs, or gallops,no edema  Respiratory:  Clear to auscultation bilaterally, normal work of breathing GI: soft, nontender, nondistended, + BS MS: no deformity or atrophy  Skin: warm and dry, no rash Neuro:  Strength and sensation are intact Psych: euthymic mood, full affect   EKG:  Sinus bradycardia, rate of 50 bpm, RBBB, inferior Q-waves with lateral T wave inversion. Unchanged from hospital EKG with the exception of HR.   Recent Labs: 11/15/2017: BUN 16; Creatinine, Ser 1.18; Hemoglobin 14.8; Platelets 172; Potassium 3.8; Sodium 138    Lipid Panel    Component Value Date/Time   CHOL 181 11/13/2017 0043   CHOL 162 04/23/2014 1107   TRIG 63 11/13/2017 0043   TRIG 131 04/23/2014 1107   HDL 60 11/13/2017 0043   HDL 55 04/23/2014 1107   CHOLHDL 3.0 11/13/2017 0043   VLDL 13 11/13/2017 0043   LDLCALC 108 (H) 11/13/2017 0043   LDLCALC 81 04/23/2014 1107      Wt Readings from Last 3 Encounters:  11/24/17 234 lb 9.6 oz (106.4 kg)  11/15/17 240 lb 4.8 oz (109 kg)  05/01/15 252 lb 6.4 oz (114.5 kg)      Other studies Reviewed: Echocardiogram Nov 23, 2017  Left ventricle: The cavity size was normal. There was mild   concentric hypertrophy. Systolic function was mildly to   moderately reduced. The estimated ejection fraction was in the   range of 40% to 45%. Doppler parameters are consistent with   abnormal left ventricular relaxation (grade 1 diastolic   dysfunction). There was no  evidence of elevated ventricular   filling pressure by Doppler parameters. - Mitral valve: There was no regurgitation. - Right ventricle: The cavity size was normal. Wall thickness was   normal. Systolic function was normal. - Tricuspid valve: There was mild regurgitation. - Pulmonary arteries: Systolic pressure was mildly increased. PA   peak pressure: 31 mm Hg (S). - Inferior vena cava: The vessel was normal in size.  Impressions:  - There is akinesis of the basal and mid inferior and inferolateral   walls. LVEF 40-45%. RVEF appears normal.  Cardiac Cath Nov 23, 2017 Conclusion     SVG  graft was visualized by angiography.  Origin lesion is 100% stenosed.  Mid RCA lesion is 99% stenosed.  Ost RPDA lesion is 99% stenosed.  Post Atrio lesion is 100% stenosed.  Ost Cx to Prox Cx lesion is 40% stenosed.  Ost 1st Mrg lesion is 50% stenosed.  Ost 2nd Mrg lesion is 100% stenosed.  RIMA graft was visualized by angiography.  Origin to Prox Graft lesion is 100% stenosed.  SVG graft was visualized by angiography.  Ost LAD to Prox LAD lesion is 99% stenosed.  LIMA graft was visualized by angiography.  A drug-eluting stent was successfully placed using a STENT RESOLUTE ONYX 4.5X26.  Post intervention, there is a 0% residual stenosis.   1. 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  2. Severe stenosis proximal LAD. The mid and distal vessel fills from antegrade flow and from the patent LIMA graft. The vein graft to the diagonal branch is patent.  3. The circumflex has mild to moderate proximal vessel stenosis. The first obtuse marginal branch is small in caliber and has moderate stenosis. The second obtuse marginal branch is occluded. The vein graft that filled this vessel is now occluded.  4. The RCA is a large dominant artery with a very unusual, high and anterior takeoff. The vessel has a 99% mid stenosis. The distal branches are occluded. The vein  graft that had been sequential to both the PDA and posterolateral artery is now occluded.  5. Successful PTCA/DES x 1 mid RCA 6. Culprit for NSTEMI is felt to be occlusion of the vein graft to the PDA and posterolateral artery  Recommendations: Will continue DAPT with ASA and Brilinta for one year. Continue statin and beta blocker. Echo today. Probable d/c home tomorrow.       ASSESSMENT AND PLAN:  1. CAD: Hx of CABG. Recent admission for NSTEMI. Required DES to 99% RCA. Culprit lesion due to SVG to the PDA and posterior lateral artery. The SVG to the PDA and PL are now occluded. Patent LIMA graft and VG to diagonal is patent, with occlusion of the Cx is now occluded.   He is to remain on DAPT for a minimum of one year. I have explained the importance of medical compliance of this medication. He verbalizes understanding. I have answered multiple questions. He will be followed in cardiac rehab as well.   2. Hypertension: He remains on losartan and BB. He is hypotensive sometimes in the evening when he takes his BP< 989211 systolic. I have advised him to take the metoprolol 12 hours a part instead of 8 hours a part as he is doing now. He will take it with the Brilinta at night.   3. Bradycardia: Likely from metoprolol. If he becomes dizzy or fatigued he is to report this. Will consider reducing the dose.   4. Obesity: He is transitioning to a plant based diet. Will watch BP with weight loss to adjust if necessary to avoid hypotension.   5. Hypercholesterolemia: Continues on statin. Will check fasting lipids and LFT's in 6 weeks. Also check CBC as he is on Brilinta and BMET as he is on losartan.    Current medicines are reviewed at length with the patient today.    Labs/ tests ordered today include: Lipids and LFT's, BMET, CBC in 6 weeks.  Jeremy [name], ANP, AACC   11/24/2017 11:58 AM    [facility] [address], [place], [place] [address]  Phone:  [phone]; Fax: [phone]

## 2017-11-24 ENCOUNTER — Encounter: Payer: Self-pay | Admitting: Adult Health

## 2017-11-24 ENCOUNTER — Ambulatory Visit (INDEPENDENT_AMBULATORY_CARE_PROVIDER_SITE_OTHER): Payer: PPO | Admitting: Adult Health

## 2017-11-24 VITALS — BP 115/74 | HR 50 | Ht 71.0 in | Wt 234.6 lb

## 2017-11-24 DIAGNOSIS — E7800 Pure hypercholesterolemia, unspecified: Secondary | ICD-10-CM

## 2017-11-24 DIAGNOSIS — Z79899 Other long term (current) drug therapy: Secondary | ICD-10-CM | POA: Diagnosis not present

## 2017-11-24 DIAGNOSIS — I10 Essential (primary) hypertension: Secondary | ICD-10-CM | POA: Diagnosis not present

## 2017-11-24 DIAGNOSIS — I214 Non-ST elevation (NSTEMI) myocardial infarction: Secondary | ICD-10-CM

## 2017-11-24 DIAGNOSIS — I2510 Atherosclerotic heart disease of native coronary artery without angina pectoris: Secondary | ICD-10-CM

## 2017-11-24 MED ORDER — ATORVASTATIN CALCIUM 80 MG PO TABS: 80.0000 mg | ORAL_TABLET | Freq: Every day | ORAL | 1 refills | Status: DC

## 2017-11-24 MED ORDER — TICAGRELOR 90 MG PO TABS: 90.0000 mg | ORAL_TABLET | Freq: Two times a day (BID) | ORAL | 2 refills | Status: DC

## 2017-11-24 MED ORDER — VALSARTAN 40 MG PO TABS: 40.0000 mg | ORAL_TABLET | Freq: Every day | ORAL | 2 refills | Status: DC

## 2017-11-24 MED ORDER — METOPROLOL TARTRATE 25 MG PO TABS: 25.0000 mg | ORAL_TABLET | Freq: Two times a day (BID) | ORAL | 2 refills | Status: DC

## 2017-11-24 NOTE — Patient Instructions (Signed)
Medication Instructions:  NO CHANGES- Your physician recommends that you continue on your current medications as directed. Please refer to the Current Medication list given to you today.  If you need a refill on your cardiac medications before your next appointment, please call your pharmacy.  Labwork: FASTING LIPIDS, LFT, CBC AND BMET IN 6 WEEKS (AROUND 01-05-18) HERE IN OUR OFFICE AT [facility]  Take the provided lab slips with you to the lab for your blood draw.   You will need to fast. DO NOT EAT OR DRINK PAST MIDNIGHT.   Special Instructions: OK FOR CARDIAC REHAB  Follow-Up: Your physician wants you to follow-up in: 3-4 MONTHS WITH DR HARDING.   Thank you for choosing [facility] at [facility]!!

## 2017-11-25 DIAGNOSIS — I10 Essential (primary) hypertension: Secondary | ICD-10-CM | POA: Diagnosis not present

## 2017-11-25 DIAGNOSIS — E669 Obesity, unspecified: Secondary | ICD-10-CM | POA: Diagnosis not present

## 2017-11-25 DIAGNOSIS — I25810 Atherosclerosis of coronary artery bypass graft(s) without angina pectoris: Secondary | ICD-10-CM | POA: Diagnosis not present

## 2017-11-25 DIAGNOSIS — E785 Hyperlipidemia, unspecified: Secondary | ICD-10-CM | POA: Diagnosis not present

## 2017-11-25 DIAGNOSIS — Z6832 Body mass index (BMI) 32.0-32.9, adult: Secondary | ICD-10-CM | POA: Diagnosis not present

## 2017-12-30 DIAGNOSIS — Z79899 Other long term (current) drug therapy: Secondary | ICD-10-CM | POA: Diagnosis not present

## 2017-12-30 LAB — BASIC METABOLIC PANEL
BUN/Creatinine Ratio: 13 (ref 10–24)
BUN: 12 mg/dL (ref 8–27)
CALCIUM: 9.6 mg/dL (ref 8.6–10.2)
CHLORIDE: 106 mmol/L (ref 96–106)
CO2: 22 mmol/L (ref 20–29)
Creatinine, Ser: 0.9 mg/dL (ref 0.76–1.27)
GFR, EST AFRICAN AMERICAN: 103 mL/min/{1.73_m2} (ref >59)
GFR, EST NON AFRICAN AMERICAN: 89 mL/min/{1.73_m2} (ref >59)
Glucose: 87 mg/dL (ref 65–99)
POTASSIUM: 4.7 mmol/L (ref 3.5–5.2)
SODIUM: 142 mmol/L (ref 134–144)

## 2017-12-30 LAB — CBC
Hematocrit: 44.7 % (ref 37.5–51.0)
Hemoglobin: 15.7 g/dL (ref 13.0–17.7)
MCH: 32 pg (ref 26.6–33.0)
MCHC: 35.1 g/dL (ref 31.5–35.7)
MCV: 91 fL (ref 79–97)
PLATELETS: 216 10*3/uL (ref 150–450)
RBC: 4.9 x10E6/uL (ref 4.14–5.80)
RDW: 13.9 % (ref 12.3–15.4)
WBC: 6.7 10*3/uL (ref 3.4–10.8)

## 2017-12-30 LAB — HEPATIC FUNCTION PANEL
ALK PHOS: 94 IU/L (ref 39–117)
ALT: 30 IU/L (ref 0–44)
AST: 26 IU/L (ref 0–40)
Albumin: 4.3 g/dL (ref 3.6–4.8)
BILIRUBIN TOTAL: 1.1 mg/dL (ref 0.0–1.2)
BILIRUBIN, DIRECT: 0.3 mg/dL (ref 0.00–0.40)
Total Protein: 6.8 g/dL (ref 6.0–8.5)

## 2017-12-30 LAB — LIPID PANEL
CHOLESTEROL TOTAL: 93 mg/dL — AB (ref 100–199)
Chol/HDL Ratio: 2.3 ratio (ref 0.0–5.0)
HDL: 41 mg/dL (ref >39)
LDL Calculated: 36 mg/dL (ref 0–99)
Triglycerides: 81 mg/dL (ref 0–149)
VLDL Cholesterol Cal: 16 mg/dL (ref 5–40)

## 2018-02-13 ENCOUNTER — Telehealth: Payer: Self-pay | Admitting: Adult Health

## 2018-02-13 NOTE — Telephone Encounter (Signed)
New Message    *STAT* If patient is at the pharmacy, call can be transferred to refill team.   1. Which medications need to be refilled? (please list name of each medication and dose if known) metoprolol tartrate (LOPRESSOR) 25 MG tablet and valsartan (DIOVAN) 40 MG tablet  2. Which pharmacy/location (including street and city if local pharmacy) is medication to be sent to? [place], [place], [place]  3. Do they need a 30 day or 90 day supply? [place]

## 2018-02-14 MED ORDER — METOPROLOL TARTRATE 25 MG PO TABS: 25.0000 mg | ORAL_TABLET | Freq: Two times a day (BID) | ORAL | 2 refills | Status: DC

## 2018-02-14 MED ORDER — VALSARTAN 40 MG PO TABS: 40.0000 mg | ORAL_TABLET | Freq: Every day | ORAL | 2 refills | Status: DC

## 2018-03-08 ENCOUNTER — Encounter: Payer: Self-pay | Admitting: Cardiology

## 2018-03-08 ENCOUNTER — Ambulatory Visit (INDEPENDENT_AMBULATORY_CARE_PROVIDER_SITE_OTHER): Payer: PPO | Admitting: Cardiology

## 2018-03-08 VITALS — BP 137/84 | HR 47 | Ht 71.0 in | Wt 236.2 lb

## 2018-03-08 DIAGNOSIS — E785 Hyperlipidemia, unspecified: Secondary | ICD-10-CM

## 2018-03-08 DIAGNOSIS — Z79899 Other long term (current) drug therapy: Secondary | ICD-10-CM

## 2018-03-08 DIAGNOSIS — Z951 Presence of aortocoronary bypass graft: Secondary | ICD-10-CM | POA: Diagnosis not present

## 2018-03-08 DIAGNOSIS — E669 Obesity, unspecified: Secondary | ICD-10-CM | POA: Diagnosis not present

## 2018-03-08 DIAGNOSIS — I2510 Atherosclerotic heart disease of native coronary artery without angina pectoris: Secondary | ICD-10-CM

## 2018-03-08 DIAGNOSIS — I10 Essential (primary) hypertension: Secondary | ICD-10-CM

## 2018-03-08 LAB — COMPREHENSIVE METABOLIC PANEL
A/G RATIO: 1.6 (ref 1.2–2.2)
ALBUMIN: 4.2 g/dL (ref 3.6–4.8)
ALT: 30 IU/L (ref 0–44)
AST: 29 IU/L (ref 0–40)
Alkaline Phosphatase: 98 IU/L (ref 39–117)
BILIRUBIN TOTAL: 0.8 mg/dL (ref 0.0–1.2)
BUN / CREAT RATIO: 11 (ref 10–24)
BUN: 10 mg/dL (ref 8–27)
CHLORIDE: 103 mmol/L (ref 96–106)
CO2: 21 mmol/L (ref 20–29)
Calcium: 9.5 mg/dL (ref 8.6–10.2)
Creatinine, Ser: 0.9 mg/dL (ref 0.76–1.27)
GFR calc non Af Amer: 89 mL/min/{1.73_m2} (ref >59)
GFR, EST AFRICAN AMERICAN: 103 mL/min/{1.73_m2} (ref >59)
Globulin, Total: 2.6 g/dL (ref 1.5–4.5)
Glucose: 96 mg/dL (ref 65–99)
POTASSIUM: 4.6 mmol/L (ref 3.5–5.2)
Sodium: 142 mmol/L (ref 134–144)
TOTAL PROTEIN: 6.8 g/dL (ref 6.0–8.5)

## 2018-03-08 LAB — LIPID PANEL
CHOL/HDL RATIO: 2.2 ratio (ref 0.0–5.0)
Cholesterol, Total: 119 mg/dL (ref 100–199)
HDL: 53 mg/dL (ref >39)
LDL Calculated: 50 mg/dL (ref 0–99)
TRIGLYCERIDES: 82 mg/dL (ref 0–149)
VLDL Cholesterol Cal: 16 mg/dL (ref 5–40)

## 2018-03-08 NOTE — Progress Notes (Signed)
PCP: Street, [name], MD  Clinic Note: Chief Complaint  Patient presents with  . Follow-up    No new complaint  . Coronary Artery Disease    Second post non-STEMI follow-up    HPI: Jeremy [name] is a 67 y.o. male with a PMH below who presents today for 3-4 MONTH F/U FOR CAD -CABG with recent NSTEMI. (Last seen by me in clinic on May 01, 2015) CRFs: HTN, HLD.   Recent Hospitalizations:   Nov 12, 2017 -admitted for non-STEMI --> catheter revealed occluded SVG-OM with recently occluded SVG-rPAV --> PCI to native RCA with DES.  EF 40 and 45% with basal-mid inferior inferolateral akinesis.  Jeremy [name] was last seen on 11/24/2017 by Jory Sims, NP in hospital follow-up- doing well, no further angina.  Studies Personally Reviewed - (if available, images/films reviewed: From Epic Chart or Care Everywhere)  TTE Nov 14, 2017: EF 40-45%.  GR 1 DD.  Akinesis of the mid inferior and inferolateral wall  Cath-PCI Nov 14, 2017: Ost-p LAD 99% before Mod D1 -->Patent LIMA-mLAD & SVG-D1. Ost-P Cx 40%, OM1 50%, OM2 100% CTO --> RIMA-OM2 100% CTO. mRCA 99% (small rPA 99% & rPAV 100% CTO) --> SVG-rPAV 100% (? Culptrit);; DES PCI mRCA: Resolute Onyx DES 4.5 mm x 26 mm (4.6 mm); LVEDP 20 mmHg  Interval History: Jeremy [name] is doing quite well now with no recurrent pains.  No further episodes of angina with either rest or exertion.  Is now routinely active without any significant exertional dyspnea or chest pain. No PND, orthopnea or edema. No palpitations, lightheadedness, dizziness, weakness or syncope/near syncope. No TIA/amaurosis fugax symptoms. No melena, hematochezia, hematuria, or epstaxis. - bruising not too bad No claudication.  ROS: A comprehensive was performed. ROS   I have reviewed and (if needed) personally updated the patient's problem list, medications, allergies, past medical and surgical history, social and family history.   Past Medical History:  Diagnosis Date  . CAD  (coronary artery disease), autologous vein bypass graft 11/2017   100% CTO SVG-OM2 & new 100% SVG-rPAV. --> PCI of native RCA  . CAD in native artery 03/2005   a) pLAD 90&95%, mid 75%, D1 & D2 - patent; Cx-OM1 ok, OM2 100%, distal Cx mild disease; RCA 50-60% mid, 75% distal).;;; b) 11/2017 - PCI -99% mRCA w. residual 99% rPDA & 100% CTO PAV  . History of kidney stones   . HLD (hyperlipidemia)   . HTN (hypertension), benign   . Non-ST elevation myocardial infarction (NSTEMI), subendocardial infarction ([place]) 03/2005, 11/2017   a) CATH with MV CAD --> CABG x 4; b) 11/14/17 PCI/DES (Resolute Onyx 4.5 x 26) to mRCA, 2/4 patent grafts (LIMA-LAD, SVG-Diag). EF 40-45%.   . Obesity (BMI 30.0-34.9)   . S/P CABG x 5 03/2005   frLIMA-LAD, frRIMA-OM1, SVG-D1, seq SVG-rPDA-RPL    Past Surgical History:  Procedure Laterality Date  . APPENDECTOMY    . CARDIAC CATHETERIZATION  Sept 7, 2006   requiring CABG; (pLAD 90&95%, mid 75%, D1 & D2 - patent; Cx-OM1 ok, OM2 100%, distal Cx mild disease; RCA 50-60% mid, 75% distal)  . CORONARY ARTERY BYPASS GRAFT  SEPT 2006   CABGX 5: fr LIMA-LAD, fr RIMA-OM1, SVG-D1 , seq SVG-PDA/PLA   . CORONARY STENT INTERVENTION N/A 11/14/2017   Procedure: CORONARY STENT INTERVENTION;  Surgeon: [name], MD;  Location: [place] [facility];:: DES PCI mRCA: Resolute Onyx DES 4.5 mm x 26 mm (4.6 mm);   [name] HERNIA  REPAIR    . LEFT HEART CATH AND CORS/GRAFTS ANGIOGRAPHY N/A 11/14/2017   Procedure: LEFT HEART CATH AND CORS/GRAFTS ANGIOGRAPHY;  Surgeon: [name], MD;  Location: [place] [facility]:: Ost-p LAD 99% before Mod D1 -->Patent LIMA-mLAD & SVG-D1. Ost-P Cx 40%, OM1 50%, OM2 100% CTO --> RIMA-OM2 100% CTO. mRCA 99% (small rPA 99% & rPAV 100% CTO); LVEDP 20 mmHg --> SVG-rPAV 100% (? Culptrit);;  --> PCI RCA  . NM [facility] SINGLE W/SPECT  Oct 2011   LOW RISK: and no significant ischemia  . TONSILLECTOMY    . TRANSTHORACIC ECHOCARDIOGRAM  NOV 2012   EF 50 TO  55%,NO WALL MOTION ABNORM   . TRANSTHORACIC ECHOCARDIOGRAM  11/2017   EF 40 and 45%.  GR 1 DD.  Akinesis of the mid inferior and inferolateral wall.  Relatively normal valves.    Current Meds  Medication Sig  . aspirin EC 81 MG EC tablet Take 1 tablet (81 mg total) by mouth daily.  [name] atorvastatin (LIPITOR) 80 MG tablet Take 1 tablet (80 mg total) by mouth daily at 6 PM.  . metoprolol tartrate (LOPRESSOR) 25 MG tablet Take 1 tablet (25 mg total) by mouth 2 (two) times daily.  . nitroGLYCERIN (NITROSTAT) 0.4 MG SL tablet Place 1 tablet (0.4 mg total) under the tongue every 5 (five) minutes x 3 doses as needed for chest pain.  . ranitidine (ZANTAC) 150 MG tablet Take 150 mg by mouth at bedtime.  . ticagrelor (BRILINTA) 90 MG TABS tablet Take 1 tablet (90 mg total) by mouth 2 (two) times daily.  . valsartan (DIOVAN) 40 MG tablet Take 1 tablet (40 mg total) by mouth daily.    No Known Allergies  Social History   Tobacco Use  . Smoking status: Never Smoker  . Smokeless tobacco: Never Used  Substance Use Topics  . Alcohol use: No  . Drug use: No   Social History   Social History Narrative   Married father of one. 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  Works full-time at [employer].(12 hr shifts)   Never smoked.     Walks daily for ~30 min/day.      family history includes Heart attack (age of onset: 7) in his mother; Heart attack (age of onset: 58) in his father.  Wt Readings from Last 3 Encounters:  03/08/18 236 lb 3.2 oz (107.1 kg)  11/24/17 234 lb 9.6 oz (106.4 kg)  11/15/17 240 lb 4.8 oz (109 kg)    PHYSICAL EXAM BP 137/84   Pulse (!) 47   Ht 5\' 11"  (1.803 m)   Wt 236 lb 3.2 oz (107.1 kg)   SpO2 94%   BMI 32.94 kg/m  Physical Exam   Adult ECG Report Not checked  Other studies Reviewed: Additional studies/ records that were reviewed today include:  Recent Labs:   Lab Results  Component Value Date   CHOL 119 03/08/2018   HDL 53 03/08/2018   LDLCALC  50 03/08/2018   TRIG 82 03/08/2018   CHOLHDL 2.2 03/08/2018      ASSESSMENT / PLAN: Problem List Items Addressed This Visit    Atherosclerotic heart disease of native coronary artery without angina pectoris (Chronic)    13 years post CABG now 4 months post non-STEMI with 2 vein grafts closed.  Both arterial grafts are patent.  He is status post PCI of the native RCA leaving only the circumflex territory not perfused.  Slight reduction in EF by echo but no  active angina or heart failure symptoms.  Plan: Now on aspirin and Brilinta with plans to continue for minimum 1 year (low threshold for stopping aspirin for any bleeding or bruising). ->  Decrease to 60 mg Brilinta at 1 year and continue for another 12 months but would be okay to stop for procedures. He is on stable low-dose beta-blocker which is the most he can tolerate with low resting heart rate. Is on low-dose Diovan which could probably be increased to 80 mg if continues to have elevated blood pressures.      Dyslipidemia, goal LDL below 70 (Chronic)    Last lipid check in June showed LDL of 36 on 80 of Lipitor.  I would like to see at least one more recheck in roughly December timeframe.  If still excellent control, could probably reduce dose of statin to 40 mg.      Relevant Orders   Lipid panel (Completed)   Comprehensive metabolic panel (Completed)   Essential hypertension (Chronic)    Blood pressure is okay.  High normal.  Low threshold for titrating up ARB, but no wound to titrate beta-blocker.      Obesity (BMI 30-39.9) (Chronic)    He plans to adopt a new healthy diet based on measures she is done including a book written by a physician indicating that he can regress his coronary disease by eating his diet.  I also reviewed the importance of being active and exercising.      S/P CABG x 5 - Primary (Chronic)   Relevant Orders   Comprehensive metabolic panel (Completed)    Other Visit Diagnoses    Coronary artery  disease involving native coronary artery of native heart without angina pectoris       Relevant Orders   Comprehensive metabolic panel (Completed)   Medication management       Relevant Orders   Lipid panel (Completed)   Comprehensive metabolic panel (Completed)      I spent a total of 30 minutes with the patient and chart review. >  50% of the time was spent in direct patient consultation.   Current medicines are reviewed at length with the patient today.  (+/- concerns) n/a The following changes have been made:  n/a  Patient Instructions  NO MEDICATION CHANGES TODAY.    LABS TODAY CMP LIPID     Your physician wants you to follow-up in JAN/FEB 2020 WITH DR Legacy Lacivita. You will receive a reminder letter in the mail two months in advance. If you don't receive a letter, please call our office to schedule the follow-up appointment.    If you need a refill on your cardiac medications before your next appointment, please call your pharmacy.     Studies Ordered:   Orders Placed This Encounter  Procedures  . Lipid panel  . Comprehensive metabolic panel      Glenetta Hew, M.D., M.S. Interventional Cardiologist   Pager # [phone] Phone # [phone] [address] [place], [place] [address]   Thank you for choosing [facility] at [facility]!!

## 2018-03-08 NOTE — Patient Instructions (Addendum)
NO MEDICATION CHANGES TODAY.    LABS TODAY CMP LIPID     Your physician wants you to follow-up in JAN/FEB 2020 WITH DR HARDING. You will receive a reminder letter in the mail two months in advance. If you don't receive a letter, please call our office to schedule the follow-up appointment.    If you need a refill on your cardiac medications before your next appointment, please call your pharmacy.

## 2018-03-10 ENCOUNTER — Encounter: Payer: Self-pay | Admitting: Cardiology

## 2018-03-10 NOTE — Assessment & Plan Note (Signed)
13 years post CABG now 4 months post non-STEMI with 2 vein grafts closed.  Both arterial grafts are patent.  He is status post PCI of the native RCA leaving only the circumflex territory not perfused.  Slight reduction in EF by echo but no active angina or heart failure symptoms.  Plan: Now on aspirin and Brilinta with plans to continue for minimum 1 year (low threshold for stopping aspirin for any bleeding or bruising). ->  Decrease to 60 mg Brilinta at 1 year and continue for another 12 months but would be okay to stop for procedures. He is on stable low-dose beta-blocker which is the most he can tolerate with low resting heart rate. Is on low-dose Diovan which could probably be increased to 80 mg if continues to have elevated blood pressures.

## 2018-03-10 NOTE — Assessment & Plan Note (Signed)
Blood pressure is okay.  High normal.  Low threshold for titrating up ARB, but no wound to titrate beta-blocker.

## 2018-03-10 NOTE — Assessment & Plan Note (Signed)
Last lipid check in June showed LDL of 36 on 80 of Lipitor.  I would like to see at least one more recheck in roughly December timeframe.  If still excellent control, could probably reduce dose of statin to 40 mg.

## 2018-03-10 NOTE — Assessment & Plan Note (Signed)
He plans to adopt a new healthy diet based on measures she is done including a book written by a physician indicating that he can regress his coronary disease by eating his diet.  I also reviewed the importance of being active and exercising.

## 2018-03-13 ENCOUNTER — Ambulatory Visit: Payer: PPO | Admitting: Cardiology

## 2018-03-17 ENCOUNTER — Telehealth: Payer: Self-pay | Admitting: Cardiology

## 2018-03-17 DIAGNOSIS — Z79899 Other long term (current) drug therapy: Secondary | ICD-10-CM

## 2018-03-17 NOTE — Telephone Encounter (Signed)
Patient calling for lab results.  Please call.

## 2018-03-17 NOTE — Telephone Encounter (Signed)
Pt called for results of recent lab work. Advised that we are awaiting the review of Dr. Ellyn Hack but preliminary results show all labs are stable. Pt verbalized understanding and requested that after Dr. Ellyn Hack review, he would like a copy mailed to him. Routing to MD and Nurse.

## 2018-03-19 NOTE — Telephone Encounter (Signed)
Somehow his labs were not in my inbox:  Overall labs look pretty good.  Still pretty much in goal.  Kidney function and electrolytes look good.  Liver function tests look good.  Glucose level is 96. Cholesterol levels still look good with a total cholesterol of 119, triglycerides 82, HDL 53 and LDL 50.  Not quite as dramatic as last check, but still looks good.  -At this point I think we can reduce atorvastatin to 40 mg daily. -->  He can cut his 80 mg tablets in half and take 1/2 tablet p.o. Daily.  Reassess lipid panel in 6 months.  Glenetta Hew, MD

## 2018-03-20 MED ORDER — ATORVASTATIN CALCIUM 40 MG PO TABS: 40.0000 mg | ORAL_TABLET | Freq: Every day | ORAL | 3 refills | Status: DC

## 2018-03-20 NOTE — Telephone Encounter (Signed)
Pt updated with lab work results along with Dr Allison Quarry recommendation. Pt verbalized understanding. New orders placed for 6 mo lab work and medication change. Results mailed out at the request of pt to review.

## 2018-06-03 ENCOUNTER — Other Ambulatory Visit: Payer: Self-pay | Admitting: Cardiology

## 2018-06-05 NOTE — Telephone Encounter (Signed)
°*  STAT* If patient is at the pharmacy, call can be transferred to refill team.   1. Which medications need to be refilled? (please list name of each medication and dose if known) Need new prescriptions for Metoprolol, Brilinta and Valsartan  2. Which pharmacy/location (including street and city if local pharmacy) is medication to be sent to?[facility] [phone]  3. Do they need a 30 day or 90 day supply? 60 for Metoprolol and Brilinta and 30 for [employer]

## 2018-06-06 ENCOUNTER — Other Ambulatory Visit: Payer: Self-pay

## 2018-06-06 MED ORDER — VALSARTAN 40 MG PO TABS: 40.0000 mg | ORAL_TABLET | Freq: Every day | ORAL | 8 refills | Status: DC

## 2018-06-30 ENCOUNTER — Other Ambulatory Visit (HOSPITAL_COMMUNITY): Payer: Self-pay | Admitting: Cardiology

## 2018-07-18 ENCOUNTER — Ambulatory Visit (INDEPENDENT_AMBULATORY_CARE_PROVIDER_SITE_OTHER): Payer: PPO | Admitting: Cardiology

## 2018-07-18 ENCOUNTER — Encounter: Payer: Self-pay | Admitting: Cardiology

## 2018-07-18 VITALS — BP 142/82 | HR 54 | Ht 71.0 in | Wt 253.4 lb

## 2018-07-18 DIAGNOSIS — I10 Essential (primary) hypertension: Secondary | ICD-10-CM

## 2018-07-18 DIAGNOSIS — E669 Obesity, unspecified: Secondary | ICD-10-CM

## 2018-07-18 DIAGNOSIS — Z125 Encounter for screening for malignant neoplasm of prostate: Secondary | ICD-10-CM | POA: Diagnosis not present

## 2018-07-18 DIAGNOSIS — I2510 Atherosclerotic heart disease of native coronary artery without angina pectoris: Secondary | ICD-10-CM | POA: Diagnosis not present

## 2018-07-18 DIAGNOSIS — I214 Non-ST elevation (NSTEMI) myocardial infarction: Secondary | ICD-10-CM | POA: Diagnosis not present

## 2018-07-18 DIAGNOSIS — E785 Hyperlipidemia, unspecified: Secondary | ICD-10-CM

## 2018-07-18 NOTE — Progress Notes (Signed)
PCP: Street, [name], MD  Clinic Note: Chief Complaint  Patient presents with  . Follow-up    No complaints - > just notes that it is hard to lose weight.  . Coronary Artery Disease    Second follow-up after non-STEMI.    HPI: Jeremy [name] is a 68 y.o. male with a PMH below who presents today for 3-4 MONTH F/U FOR CAD -CABG with recent NSTEMI. (Last seen by me in clinic on May 01, 2015) CRFs: HTN, HLD.    MV CAD on Cath 2006 -- CABG X 5  Nov 12, 2017 -admitted for non-STEMI --> catheter revealed occluded SVG-OM with recently occluded SVG-rPAV --> PCI to native RCA with DES.  EF 40 and 45% with basal-mid inferior inferolateral akinesis.  Recent Hospitalizations:   None recently.  KAIL FRALEY was last seen on 11/24/2017 by Jory Sims, NP in hospital follow-up- doing well, no further angina.  Studies Personally Reviewed - (if available, images/films reviewed: From Epic Chart or Care Everywhere)  TTE Nov 14, 2017: EF 40-45%.  GR 1 DD.  Akinesis of the mid inferior and inferolateral wall  Cath-PCI Nov 14, 2017: Ost-p LAD 99% before Mod D1 -->Patent LIMA-mLAD & SVG-D1. Ost-P Cx 40%, OM1 50%, OM2 100% CTO --> RIMA-OM2 100% CTO. mRCA 99% (small rPA 99% & rPAV 100% CTO) --> SVG-rPAV 100% (? Culptrit);; DES PCI mRCA: Resolute Onyx DES 4.5 mm x 26 mm (4.6 mm); LVEDP 20 mmHg  Interval History: Danyell is doing quite well now with no recurrent pains.  No further episodes of angina with either rest or exertion.  Is now routinely active without any significant exertional dyspnea or chest pain. No PND, orthopnea or edema. No palpitations, lightheadedness, dizziness, weakness or syncope/near syncope. No TIA/amaurosis fugax symptoms. No melena, hematochezia, hematuria, or epstaxis. - bruising not too bad No claudication.  ROS: A comprehensive was performed. Review of Systems  Constitutional: Negative for malaise/fatigue and weight loss (Despite his best efforts, he is actually  gaining weight.  Has adjusted his diet, get exercise.).  HENT: Negative for congestion and nosebleeds.   Respiratory: Negative for shortness of breath.   Musculoskeletal: Positive for joint pain (Hip and knee pain). Negative for myalgias.  Neurological: Negative for dizziness and focal weakness.  Psychiatric/Behavioral: Negative for depression and memory loss. The patient is not nervous/anxious and does not have insomnia.    I have reviewed and (if needed) personally updated the patient's problem list, medications, allergies, past medical and surgical history, social and family history.   Past Medical History:  Diagnosis Date  . Bifascicular bundle branch block 11/2017   RBBB & LAFB  . CAD (coronary artery disease), autologous vein bypass graft 11/2017   100% CTO SVG-OM2 & new 100% SVG-rPAV. --> PCI of native RCA  . CAD in native artery 03/2005   a) pLAD 90&95%, mid 75%, D1 & D2 - patent; Cx-OM1 ok, OM2 100%, distal Cx mild disease; RCA 50-60% mid, 75% distal) --CABG X 5;; b) 11/2017 - PCI -99% mRCA w. residual 99% rPDA & 100% CTO PAV  . History of kidney stones   . HLD (hyperlipidemia)   . HTN (hypertension), benign   . Non-ST elevation myocardial infarction (NSTEMI), subendocardial infarction ([place]) 03/2005, 11/2017   a) CATH with MV CAD --> CABG x 4; b) 11/14/17 PCI/DES (Resolute Onyx 4.5 x 26) to mRCA, 2/4 patent grafts (LIMA-LAD, SVG-Diag). EF 40-45%.   . Obesity (BMI 30.0-34.9)   . 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 APPENDECTOMY    . CORONARY ARTERY BYPASS GRAFT  SEPT 2006   CABGX 5: fr LIMA-LAD, fr RIMA-OM1, SVG-D1 , seq SVG-PDA/PLA   . CORONARY STENT INTERVENTION N/A 11/14/2017   Procedure: CORONARY STENT INTERVENTION;  Surgeon: [name], MD;  Location: [facility];:: DES PCI mRCA: Resolute Onyx DES 4.5 mm x 26 mm (4.6 mm);   [name] HERNIA REPAIR    . LEFT HEART CATH AND  CORONARY ANGIOGRAPHY  Sept 7, 2006   requiring CABG; (pLAD 90&95%, mid 75%, D1 & D2 - patent; Cx-OM1 ok, OM2 100%, distal Cx mild disease; RCA 50-60% mid, 75% distal)  . LEFT HEART CATH AND CORS/GRAFTS ANGIOGRAPHY N/A 11/14/2017   Procedure: LEFT HEART CATH AND CORS/GRAFTS ANGIOGRAPHY;  Surgeon: [name], MD;  Location: [facility]:: Ost-p LAD 99% before Mod D1 -->Patent LIMA-mLAD & SVG-D1. Ost-P Cx 40%, OM1 50%, OM2 100% CTO --> RIMA-OM2 100% CTO. mRCA 99% (small rPA 99% & rPAV 100% CTO); LVEDP 20 mmHg --> SVG-rPAV 100% (? Culptrit);;  --> PCI RCA  . NM [facility] SINGLE W/SPECT  Oct 2011   LOW RISK: and no significant ischemia  . TONSILLECTOMY    . TRANSTHORACIC ECHOCARDIOGRAM  NOV 2012   EF 50 TO 55%,NO WALL MOTION ABNORM   . TRANSTHORACIC ECHOCARDIOGRAM  11/2017   EF 40 and 45%.  GR 1 DD.  Akinesis of the mid inferior and inferolateral wall.  Relatively normal valves.     Cath-PCI Nov 14, 2017:  DES PCI mRCA: Resolute Onyx DES 4.5 mm x 26 mm (4.6 mm); LVEDP 20 mmHg    Current Meds  Medication Sig  . aspirin EC 81 MG EC tablet Take 1 tablet (81 mg total) by mouth daily.  [name] atorvastatin (LIPITOR) 40 MG tablet Take 1 tablet (40 mg total) by mouth daily at 6 PM.  . BRILINTA 90 MG TABS tablet TAKE ONE TABLET BY MOUTH TWICE DAILY  . metoprolol tartrate (LOPRESSOR) 25 MG tablet Take 1 tablet (25 mg total) by mouth 2 (two) times daily.  . nitroGLYCERIN (NITROSTAT) 0.4 MG SL tablet Place 1 tablet (0.4 mg total) under the tongue every 5 (five) minutes x 3 doses as needed for chest pain.  . ranitidine (ZANTAC) 150 MG tablet Take 150 mg by mouth at bedtime.  . valsartan (DIOVAN) 40 MG tablet Take 1 tablet (40 mg total) by mouth daily.    No Known Allergies  Social History   Tobacco Use  . Smoking status: Never Smoker  . Smokeless tobacco: Never Used  Substance Use Topics  . Alcohol use: No  . Drug use: No   Social History   Social History Narrative   Married father  of one. 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  Works full-time at [employer].(12 hr shifts)   Never smoked.     Walks daily for ~30 min/day.      family history includes Heart attack (age of onset: 86) in his mother; Heart attack (age of onset: 8) in his father.  Wt Readings from Last 3 Encounters:  07/18/18 253 lb 6.4 oz (114.9 kg)  03/08/18 236 lb 3.2 oz (107.1 kg)  11/24/17 234 lb 9.6 oz (106.4 kg)  -- Eating Season weight.    PHYSICAL EXAM BP (!) 142/82   Pulse (!) 54   Ht 5\' 11"  (1.803 m)   Wt  253 lb 6.4 oz (114.9 kg)   BMI 35.34 kg/m  Physical Exam  Constitutional: He is oriented to person, place, and time. He appears well-developed and well-nourished. No distress.  Moderately obese, mostly truncal  HENT:  Head: Normocephalic and atraumatic.  Eyes: Pupils are equal, round, and reactive to light.  Neck: Normal range of motion. Neck supple. No hepatojugular reflux and no JVD present. Carotid bruit is not present.  Cardiovascular: Regular rhythm, normal heart sounds and intact distal pulses.  No extrasystoles are present. Bradycardia present. PMI is not displaced. Exam reveals no gallop and no friction rub.  No murmur heard. Pulmonary/Chest: Effort normal and breath sounds normal. No respiratory distress. He has no wheezes. He has no rales.  Abdominal: Soft. Bowel sounds are normal. He exhibits no distension. There is no abdominal tenderness. There is no rebound.  Obese.  Unable to assess HSM  Musculoskeletal: Normal range of motion.        General: No edema.  Neurological: He is alert and oriented to person, place, and time.  Psychiatric: He has a normal mood and affect. His behavior is normal. Judgment and thought content normal.  Vitals reviewed.    Adult ECG Report Sinus bradycardia, rate 54.  LAFB (-70), RBBB.  Otherwise normal intervals durations.  Stable EKG.  Other studies Reviewed: Additional studies/ records that were reviewed today include:  Recent  Labs:   Lab Results  Component Value Date   CHOL 119 03/08/2018   HDL 53 03/08/2018   LDLCALC 50 03/08/2018   TRIG 82 03/08/2018   CHOLHDL 2.2 03/08/2018    ASSESSMENT / PLAN: Problem List Items Addressed This Visit    Atherosclerotic heart disease of native coronary artery without angina pectoris - Primary (Chronic)    No further anginal symptoms following PCI.[name] He has a totally occluded LAD and OM branch.  Also vein graft to the OM is occluded.  This leads I distribution not revascularized. Is on statin, ARB and beta-blocker stable dose. He is on aspirin and Brilinta for his recent DES stent. -->  Would be okay to hold aspirin for bruising, but continue Brilinta for now until 1 year post MI.  Would probably then stop aspirin altogether and reduce to maintenance dose of Brilinta for 1 more year (and at that time it would be okay to hold for procedures).      Relevant Orders   EKG 12-Lead (Completed)   Comprehensive metabolic panel   PSA   Dyslipidemia, goal LDL below 70 (Chronic)    Lipids still look good in September, but not as good is in May at the time of his MI.  Probably more accurate reading.  He is currently on 40 mg of atorvastatin.  Should be due for labs this month.  We will then order lipid panel. Probably continue on 40 mg of atorvastatin unless levels have gone up      Relevant Orders   Lipid panel   Essential hypertension (Chronic)    His blood pressure is a little bit high today, but he said yesterday was 98/60.  As such, I do not think I would want to push any further antihypertensives on him.  He is on minimal dose of metoprolol and valsartan.  Would not titrate further.      Relevant Orders   EKG 12-Lead (Completed)   PSA   NSTEMI (non-ST elevated myocardial infarction) ([place])    Non-STEMI in May 2019 occluded vein graft noted and native RCA treated with PCI.  Mildly reduced EF with mid inferior and inferolateral hypokinesis. No signs symptoms of heart  failure.  We discussed the possibility of recheck an echocardiogram to reevaluate, but for now we will hold off        Obesity (BMI 30-39.9) (Chronic)    He says he is doing the best he can.  I think he must of gained some weight during the "eating season "from Thanksgiving to Christmas.  He says other than that though he has been eating oatmeal and salads all the time almost every day.  The biggest problem is he has not yet started exercising.  I told him that he needs to start out slowly but needs to get some exercise at least 20 to 30 minutes 5 days a week.      Relevant Orders   PSA    Other Visit Diagnoses    Prostate cancer screening       Relevant Orders   PSA      I spent a total of 30 minutes with the patient and chart review. >  50% of the time was spent in direct patient consultation.   Current medicines are reviewed at length with the patient today.  (+/- concerns) n/a The following changes have been made:  n/a  Patient Instructions  Medication Instructions:  NOT NEEDED If you need a refill on your cardiac medications before your next appointment, please call your pharmacy.   Lab work:FASTING  CMP LIPID PSA If you have labs (blood work) drawn today and your tests are completely normal, you will receive your results only by: [name] MyChart Message (if you have MyChart) OR . A paper copy in the mail If you have any lab test that is abnormal or we need to change your treatment, we will call you to review the results.  Testing/Procedures: NEEDED   Follow-Up: At [facility], you and your health needs are our priority.  As part of our continuing mission to provide you with exceptional heart care, we have created designated Provider Care Teams.  These Care Teams include your primary Cardiologist (physician) and Advanced Practice Providers (APPs -  Physician Assistants and Nurse Practitioners) who all work together to provide you with the care you need, when you need  it. You will need a follow up appointment in 5 months June 2020.  Please call our office 2 months in advance to schedule this appointment.  You may see No primary care provider on file. or one of the following Advanced Practice Providers on your designated Care Team:   Rosaria Ferries, PA-C . Jory Sims, DNP, ANP  Any Other Special Instructions Will Be Listed Below (If Applicable).      Studies Ordered:   Orders Placed This Encounter  Procedures  . Lipid panel  . Comprehensive metabolic panel  . PSA  . EKG 12-Lead      Glenetta Hew, M.D., M.S. Interventional Cardiologist   Pager # [phone] Phone # [phone] [address] [place], [place] [address]   Thank you for choosing Heartcare at [facility]!!

## 2018-07-18 NOTE — Patient Instructions (Signed)
Medication Instructions:  NOT NEEDED If you need a refill on your cardiac medications before your next appointment, please call your pharmacy.   Lab work:FASTING  CMP LIPID PSA If you have labs (blood work) drawn today and your tests are completely normal, you will receive your results only by: [name] MyChart Message (if you have MyChart) OR . A paper copy in the mail If you have any lab test that is abnormal or we need to change your treatment, we will call you to review the results.  Testing/Procedures: NEEDED   Follow-Up: At [facility], you and your health needs are our priority.  As part of our continuing mission to provide you with exceptional heart care, we have created designated Provider Care Teams.  These Care Teams include your primary Cardiologist (physician) and Advanced Practice Providers (APPs -  Physician Assistants and Nurse Practitioners) who all work together to provide you with the care you need, when you need it. You will need a follow up appointment in 5 months June 2020.  Please call our office 2 months in advance to schedule this appointment.  You may see No primary care provider on file. or one of the following Advanced Practice Providers on your designated Care Team:   Rosaria Ferries, PA-C . Jory Sims, DNP, ANP  Any Other Special Instructions Will Be Listed Below (If Applicable).

## 2018-07-19 ENCOUNTER — Encounter: Payer: Self-pay | Admitting: Cardiology

## 2018-07-19 NOTE — Assessment & Plan Note (Signed)
Lipids still look good in September, but not as good is in May at the time of his MI.  Probably more accurate reading.  He is currently on 40 mg of atorvastatin.  Should be due for labs this month.  We will then order lipid panel. Probably continue on 40 mg of atorvastatin unless levels have gone up

## 2018-07-19 NOTE — Assessment & Plan Note (Signed)
Non-STEMI in May 2019 occluded vein graft noted and native RCA treated with PCI.  Mildly reduced EF with mid inferior and inferolateral hypokinesis. No signs symptoms of heart failure.  We discussed the possibility of recheck an echocardiogram to reevaluate, but for now we will hold off

## 2018-07-19 NOTE — Assessment & Plan Note (Signed)
He says he is doing the best he can.  I think he must of gained some weight during the "eating season "from Thanksgiving to Christmas.  He says other than that though he has been eating oatmeal and salads all the time almost every day.  The biggest problem is he has not yet started exercising.  I told him that he needs to start out slowly but needs to get some exercise at least 20 to 30 minutes 5 days a week.

## 2018-07-19 NOTE — Assessment & Plan Note (Addendum)
No further anginal symptoms following PCI.[name] He has a totally occluded LAD and OM branch.  Also vein graft to the OM is occluded.  This leads I distribution not revascularized. Is on statin, ARB and beta-blocker stable dose. He is on aspirin and Brilinta for his recent DES stent. -->  Would be okay to hold aspirin for bruising, but continue Brilinta for now until 1 year post MI.  Would probably then stop aspirin altogether and reduce to maintenance dose of Brilinta for 1 more year (and at that time it would be okay to hold for procedures).

## 2018-07-19 NOTE — Assessment & Plan Note (Signed)
His blood pressure is a little bit high today, but he said yesterday was 98/60.  As such, I do not think I would want to push any further antihypertensives on him.  He is on minimal dose of metoprolol and valsartan.  Would not titrate further.

## 2018-07-26 DIAGNOSIS — H26493 Other secondary cataract, bilateral: Secondary | ICD-10-CM | POA: Diagnosis not present

## 2018-07-27 DIAGNOSIS — E669 Obesity, unspecified: Secondary | ICD-10-CM | POA: Diagnosis not present

## 2018-07-27 DIAGNOSIS — I2510 Atherosclerotic heart disease of native coronary artery without angina pectoris: Secondary | ICD-10-CM | POA: Diagnosis not present

## 2018-07-27 DIAGNOSIS — I10 Essential (primary) hypertension: Secondary | ICD-10-CM | POA: Diagnosis not present

## 2018-07-27 DIAGNOSIS — E785 Hyperlipidemia, unspecified: Secondary | ICD-10-CM | POA: Diagnosis not present

## 2018-07-27 DIAGNOSIS — Z125 Encounter for screening for malignant neoplasm of prostate: Secondary | ICD-10-CM | POA: Diagnosis not present

## 2018-07-27 LAB — COMPREHENSIVE METABOLIC PANEL
ALT: 38 IU/L (ref 0–44)
AST: 37 IU/L (ref 0–40)
Albumin/Globulin Ratio: 1.6 (ref 1.2–2.2)
Albumin: 4.2 g/dL (ref 3.8–4.8)
Alkaline Phosphatase: 146 IU/L — ABNORMAL HIGH (ref 39–117)
BUN/Creatinine Ratio: 10 (ref 10–24)
BUN: 10 mg/dL (ref 8–27)
Bilirubin Total: 1.2 mg/dL (ref 0.0–1.2)
CALCIUM: 9.5 mg/dL (ref 8.6–10.2)
CO2: 20 mmol/L (ref 20–29)
CREATININE: 1.04 mg/dL (ref 0.76–1.27)
Chloride: 103 mmol/L (ref 96–106)
GFR calc Af Amer: 86 mL/min/{1.73_m2} (ref >59)
GFR, EST NON AFRICAN AMERICAN: 74 mL/min/{1.73_m2} (ref >59)
GLOBULIN, TOTAL: 2.7 g/dL (ref 1.5–4.5)
Glucose: 86 mg/dL (ref 65–99)
Potassium: 4.4 mmol/L (ref 3.5–5.2)
Sodium: 138 mmol/L (ref 134–144)
Total Protein: 6.9 g/dL (ref 6.0–8.5)

## 2018-07-27 LAB — LIPID PANEL
CHOLESTEROL TOTAL: 109 mg/dL (ref 100–199)
Chol/HDL Ratio: 2.3 ratio (ref 0.0–5.0)
HDL: 47 mg/dL (ref >39)
LDL CALC: 47 mg/dL (ref 0–99)
TRIGLYCERIDES: 75 mg/dL (ref 0–149)
VLDL Cholesterol Cal: 15 mg/dL (ref 5–40)

## 2018-07-27 LAB — PSA: Prostate Specific Ag, Serum: 0.7 ng/mL (ref 0.0–4.0)

## 2018-08-03 ENCOUNTER — Telehealth: Payer: Self-pay | Admitting: Cardiology

## 2018-08-03 NOTE — Telephone Encounter (Signed)
New message ° ° °Patient is calling to get lab results. °

## 2018-08-03 NOTE — Telephone Encounter (Signed)
Advised patient Dr Ellyn Hack working at the hospital this week but would call once reviewed. Patient would like copy mailed once reviewed. Will forward to Cherene Altes RN

## 2018-08-07 DIAGNOSIS — H26493 Other secondary cataract, bilateral: Secondary | ICD-10-CM | POA: Diagnosis not present

## 2018-08-09 ENCOUNTER — Encounter: Payer: Self-pay | Admitting: *Deleted

## 2018-08-09 NOTE — Telephone Encounter (Signed)
The patient has been notified of the result and verbalized understanding.  All questions (if any) were answered. Raiford Simmonds, RN 08/09/2018 10:30 AM  LABS RESULT MAILED

## 2018-08-10 IMAGING — DX DG CHEST 2V
2 series · 2 of 2 positions shown · non-contrast
Comparison: 04/26/2016; 02/11/2015; 11/24/2012; CT abdomen and
pelvis - 08/05/2014

CLINICAL DATA: Chest pain for several days.

EXAM:
CHEST - 2 VIEW

[chest pa]
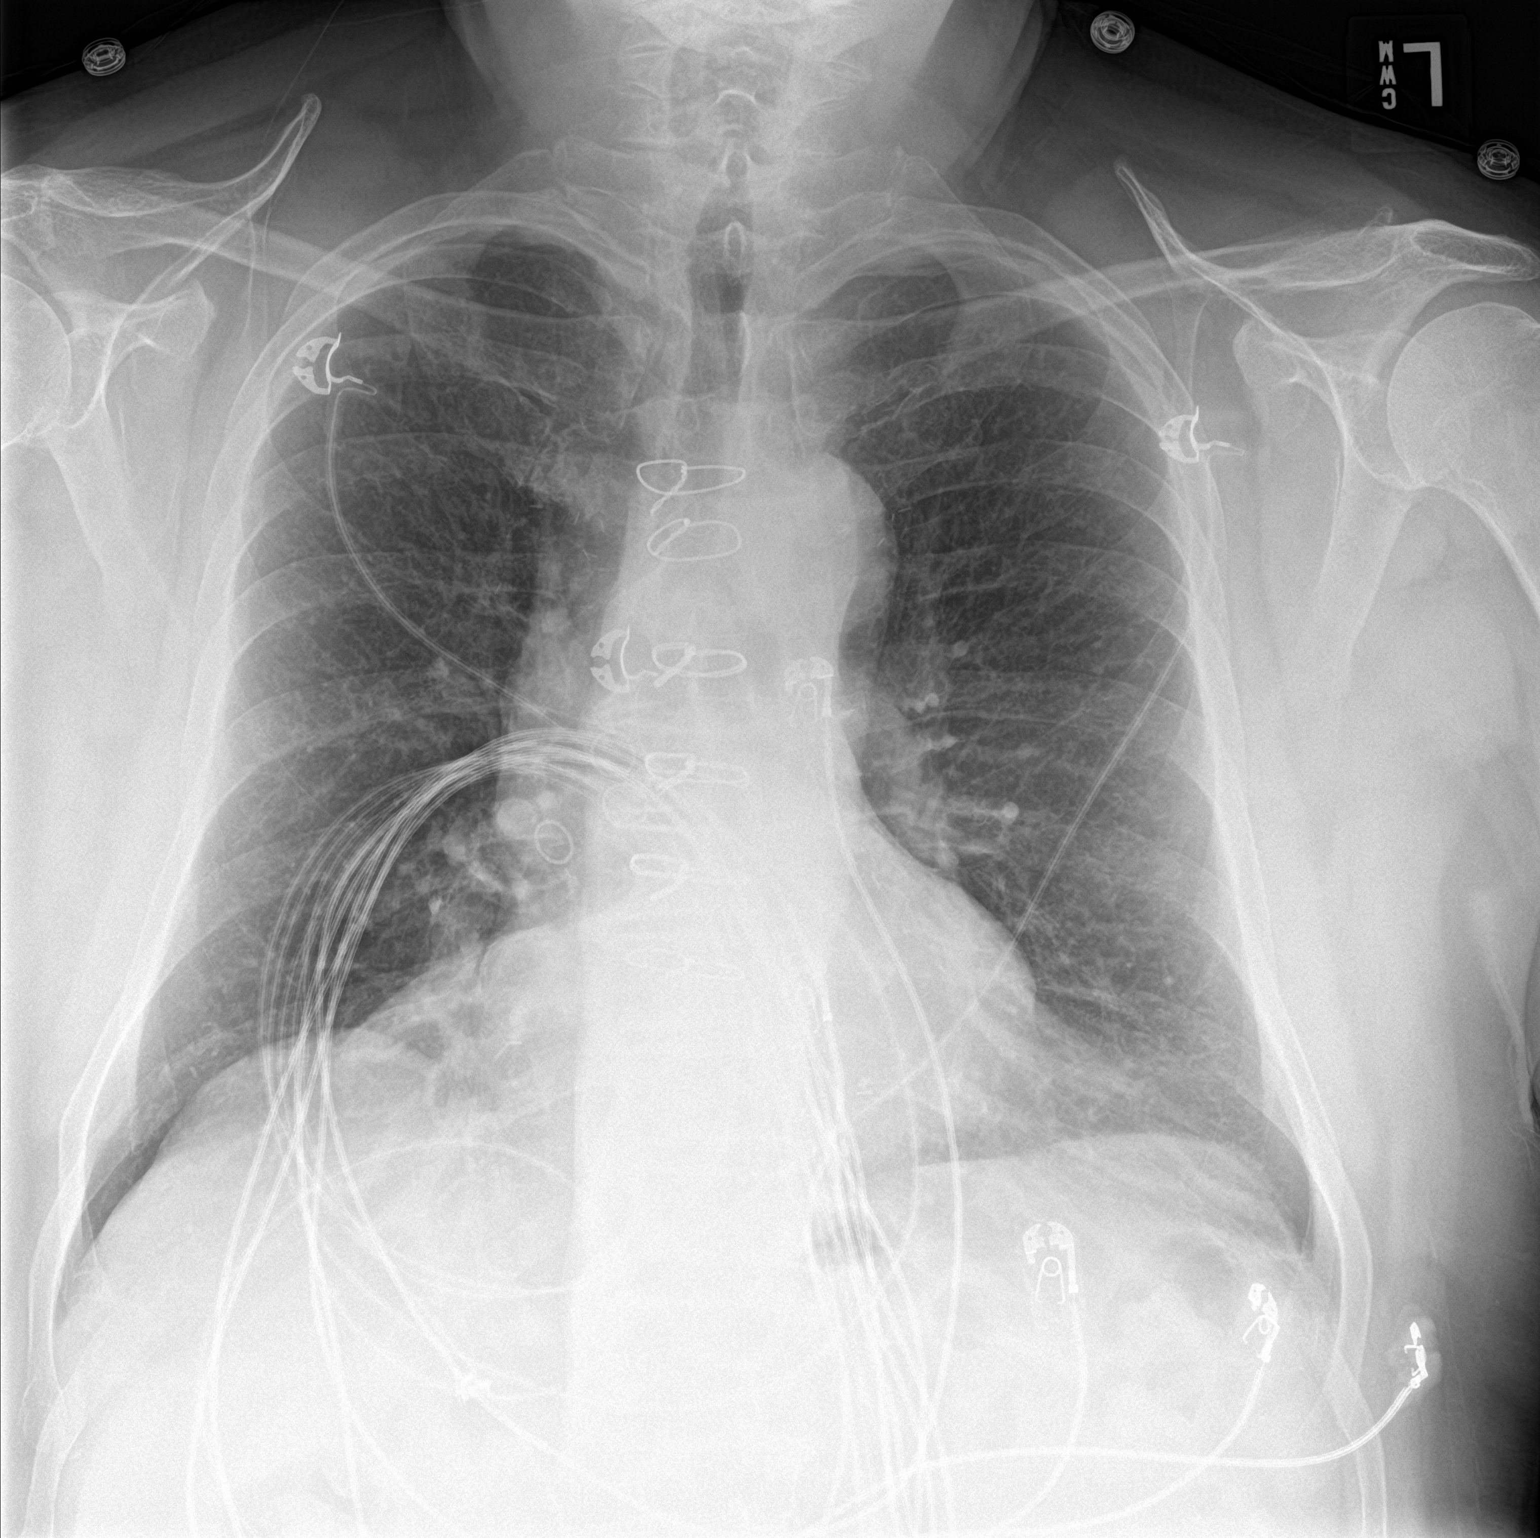

[chest lat]
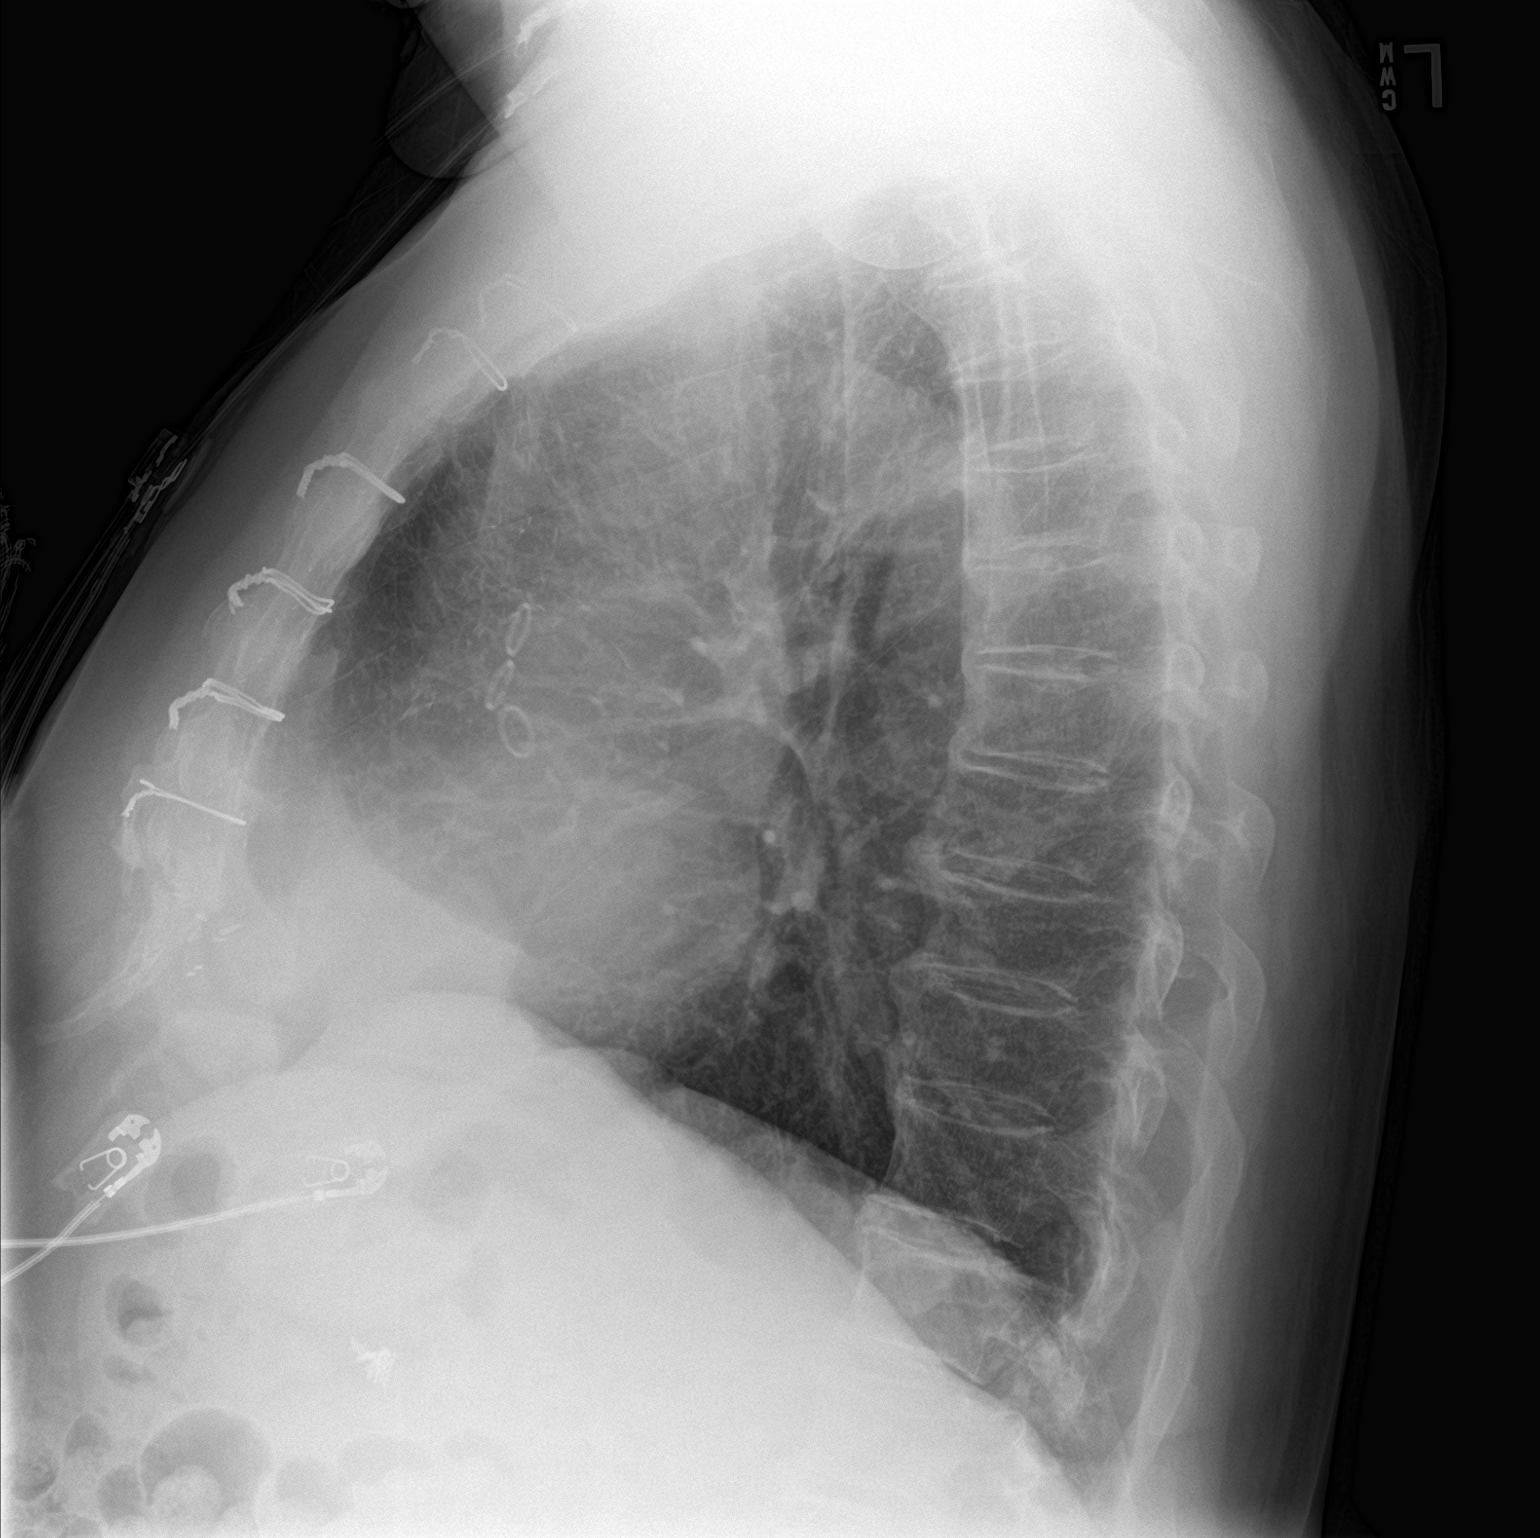

[2 of 2 positions shown; findings below may reference images not displayed]

FINDINGS: Grossly unchanged cardiac silhouette and mediastinal contours post
median sternotomy and CABG. There is persistent obscuration of the
right heart border secondary to known prominent epicardial fat pad
as demonstrated on remote abdominal CT performed [DATE]. No focal
airspace opacities. No pleural effusion or pneumothorax. No evidence
of edema. No acute osseus abnormalities. Stigmata of DISH within the
thoracic spine. Post cholecystectomy.
IMPRESSION: No acute cardiopulmonary disease.

## 2018-09-29 ENCOUNTER — Other Ambulatory Visit: Payer: Self-pay | Admitting: Cardiology

## 2018-12-02 DIAGNOSIS — M79672 Pain in left foot: Secondary | ICD-10-CM | POA: Diagnosis not present

## 2018-12-13 ENCOUNTER — Ambulatory Visit: Payer: PPO | Admitting: Cardiology

## 2018-12-21 ENCOUNTER — Telehealth: Payer: Self-pay | Admitting: *Deleted

## 2018-12-21 NOTE — Telephone Encounter (Signed)
Left message to call back - need to change to virtual visit for 6/23/20w/dr harding

## 2018-12-26 ENCOUNTER — Telehealth (INDEPENDENT_AMBULATORY_CARE_PROVIDER_SITE_OTHER): Payer: PPO | Admitting: Cardiology

## 2018-12-26 ENCOUNTER — Encounter: Payer: Self-pay | Admitting: Cardiology

## 2018-12-26 ENCOUNTER — Telehealth: Payer: Self-pay | Admitting: *Deleted

## 2018-12-26 ENCOUNTER — Ambulatory Visit: Payer: PPO | Admitting: Cardiology

## 2018-12-26 VITALS — BP 134/74 | HR 60 | Ht 71.0 in | Wt 255.0 lb

## 2018-12-26 DIAGNOSIS — I2510 Atherosclerotic heart disease of native coronary artery without angina pectoris: Secondary | ICD-10-CM | POA: Diagnosis not present

## 2018-12-26 DIAGNOSIS — E785 Hyperlipidemia, unspecified: Secondary | ICD-10-CM | POA: Diagnosis not present

## 2018-12-26 DIAGNOSIS — I255 Ischemic cardiomyopathy: Secondary | ICD-10-CM | POA: Diagnosis not present

## 2018-12-26 DIAGNOSIS — I10 Essential (primary) hypertension: Secondary | ICD-10-CM | POA: Diagnosis not present

## 2018-12-26 DIAGNOSIS — E669 Obesity, unspecified: Secondary | ICD-10-CM

## 2018-12-26 DIAGNOSIS — Z951 Presence of aortocoronary bypass graft: Secondary | ICD-10-CM

## 2018-12-26 NOTE — Patient Instructions (Addendum)
Medication Instructions:   Stop Apirin   If you need a refill on your cardiac medications before your next appointment, please call your pharmacy.   Lab work: -- Fasting Lipid Panel & CMP in Jan 2021 Will mail labslip closer to the time when labs are needed  If you have labs (blood work) drawn today and your tests are completely normal, you will receive your results only by: [name] MyChart Message (if you have MyChart) OR . A paper copy in the mail If you have any lab test that is abnormal or we need to change your treatment, we will call you to review the results.  Testing/Procedures: NOT NEEDED  Follow-Up: At [facility], you and your health needs are our priority.  As part of our continuing mission to provide you with exceptional heart care, we have created designated Provider Care Teams.  These Care Teams include your primary Cardiologist (physician) and Advanced Practice Providers (APPs -  Physician Assistants and Nurse Practitioners) who all work together to provide you with the care you need, when you need it. . You will need a follow up appointment in  6 months [place].  Please call our office 2 months in advance to schedule this appointment.  You may see Glenetta Hew, MD or one of the following Advanced Practice Providers on your designated Care Team:   . Rosaria Ferries, PA-C . Jory Sims, DNP, ANP  Any Other Special Instructions Will Be Listed Below.

## 2018-12-26 NOTE — Assessment & Plan Note (Signed)
Encouraged increased exercise and diet.  He is gained quite a bit because of the summer followed by COVID-19 restrictions.  Is hoping to get back into the gym soon.

## 2018-12-26 NOTE — Assessment & Plan Note (Signed)
Blood pressure actually is pretty good.  Given that he has had some labile nature to his pressures, I would not push any further. Continue low-dose of the Lopressor and valsartan.

## 2018-12-26 NOTE — Assessment & Plan Note (Signed)
Stable finding on echocardiogram that goes along with a non-revascularized territory.  At this point no significant heart failure symptoms.  Remains on stable dose of beta-blocker & ARB, but does not require any diuretic as he is euvolemic.

## 2018-12-26 NOTE — Progress Notes (Signed)
Virtual Visit via Telephone Note   This visit type was conducted due to national recommendations for restrictions regarding the COVID-19 Pandemic (e.g. social distancing) in an effort to limit this patient's exposure and mitigate transmission in our community.  Due to his co-morbid illnesses, this patient is at least at moderate risk for complications without adequate follow up.  This format is felt to be most appropriate for this patient at this time.  The patient did not have access to video technology/had technical difficulties with video requiring transitioning to audio format only (telephone).  All issues noted in this document were discussed and addressed.  No physical exam could be performed with this format.  Please refer to the patient's chart for his  consent to telehealth for [facility].   Patient has given verbal permission to conduct this visit via virtual appointment and to bill insurance 12/26/2018 11:16 PM     Evaluation Performed:  Follow-up visit  Date:  12/26/2018   ID:  PIO EATHERLY, DOB 18-Jul-1950, MRN [id number]  Patient Location: Home Provider Location: Home  PCP:  Street, Jeremy Mt, MD  Cardiologist:  Glenetta Hew, MD  Electrophysiologist:  None   Chief Complaint:  4-5 month f/u - CAD (CABG)  History of Present Illness:    Jeremy [name] is a 68 y.o. male with PMH notable for CAD-CABG (recent ~1 yr out from NSTEMI - occluded SVG-rPAV -> PCI native RCA) who presents via audio/video conferencing for a telehealth visit today.   MV CAD on Cath 2006 -- CABG X 5  Nov 12, 2017 -admitted for non-STEMI --> catheter revealed occluded SVG-OM with recently occluded SVG-rPAV --> PCI to native RCA with DES.  EF 40 and 45% with basal-mid inferior inferolateral akinesis.  Jeremy [name] was last seen on January 14 for his second hospital follow-up after his non-ST elevation MI back in May 2019.He is doing well without any further active episodes of angina with the rest or  exertion.  No notable cardiac symptoms.  Just a little bruising.  Interval History:  Jeremy [name] is doing very well overall with no major complaints.  He remains active and does not have any active cardiac issues.  No significant bruising or bleeding issues.    Active - can do just about what he wants to do without any issues.   Cardiovascular ROS: no chest pain or dyspnea on exertion negative for - edema, irregular heartbeat, orthopnea, palpitations, paroxysmal nocturnal dyspnea, rapid heart rate, shortness of breath or syncope / near syncope; TIA/amaurosis fugax   ROS:  Please see the history of present illness.    The patient does not have symptoms concerning for COVID-19 infection (fever, chills, cough, or new shortness of breath).  The patient is practicing social distancing.  Wears mask & washes hands.  Review of Systems  Constitutional: Negative for malaise/fatigue.  HENT: Negative for congestion.   Respiratory: Negative for cough.   Gastrointestinal: Negative for blood in stool, constipation, heartburn, nausea and vomiting.  Genitourinary: Negative for hematuria.  Musculoskeletal: Negative for joint pain.  Neurological: Negative for dizziness and headaches.  Psychiatric/Behavioral: Negative for memory loss. The patient is not nervous/anxious and does not have insomnia.   All other systems reviewed and are negative.   Past Medical History:  Diagnosis Date  . Bifascicular bundle branch block 11/2017   RBBB & LAFB  . CAD (coronary artery disease), autologous vein bypass graft 11/2017   100% CTO SVG-OM2 & new 100% SVG-rPAV. --> PCI of  native RCA  . CAD in native artery 03/2005   a) pLAD 90&95%, mid 75%, D1 & D2 - patent; Cx-OM1 ok, OM2 100%, distal Cx mild disease; RCA 50-60% mid, 75% distal) --CABG X 5;; b) 11/2017 - PCI -99% mRCA w. residual 99% rPDA & 100% CTO PAV  . History of kidney stones   . HLD (hyperlipidemia)   . HTN (hypertension), benign   . Non-ST elevation myocardial  infarction (NSTEMI), subendocardial infarction ([place]) 03/2005, 11/2017   a) CATH with MV CAD --> CABG x 4; b) 11/14/17 PCI/DES (Resolute Onyx 4.5 x 26) to mRCA, 2/4 patent grafts (LIMA-LAD, SVG-Diag). EF 40-45%.   . Obesity (BMI 30.0-34.9)   . 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 APPENDECTOMY    . CORONARY ARTERY BYPASS GRAFT  SEPT 2006   CABGX 5: fr LIMA-LAD, fr RIMA-OM1, SVG-D1 , seq SVG-PDA/PLA   . CORONARY STENT INTERVENTION N/A 11/14/2017   Procedure: CORONARY STENT INTERVENTION;  Surgeon: [name], MD;  Location: [facility];:: DES PCI mRCA: Resolute Onyx DES 4.5 mm x 26 mm (4.6 mm);   [name] HERNIA REPAIR    . LEFT HEART CATH AND CORONARY ANGIOGRAPHY  Sept 7, 2006   requiring CABG; (pLAD 90&95%, mid 75%, D1 & D2 - patent; Cx-OM1 ok, OM2 100%, distal Cx mild disease; RCA 50-60% mid, 75% distal)  . LEFT HEART CATH AND CORS/GRAFTS ANGIOGRAPHY N/A 11/14/2017   Procedure: LEFT HEART CATH AND CORS/GRAFTS ANGIOGRAPHY;  Surgeon: [name], MD;  Location: [facility]:: Ost-p LAD 99% before Mod D1 -->Patent LIMA-mLAD & SVG-D1. Ost-P Cx 40%, OM1 50%, OM2 100% CTO --> RIMA-OM2 100% CTO. mRCA 99% (small rPA 99% & rPAV 100% CTO); LVEDP 20 mmHg --> SVG-rPAV 100% (? Culptrit);;  --> PCI RCA  . [facility] SINGLE W/SPECT  Oct 2011   LOW RISK: and no significant ischemia  . TONSILLECTOMY    . TRANSTHORACIC ECHOCARDIOGRAM  NOV 2012   EF 50 TO 55%,NO WALL MOTION ABNORM   . TRANSTHORACIC ECHOCARDIOGRAM  11/2017   EF 40 and 45%.  GR 1 DD.  Akinesis of the mid inferior and inferolateral wall.  Relatively normal valves.    Cath - PCI native RCA 11/2017       Current Meds  Medication Sig  . atorvastatin (LIPITOR) 40 MG tablet Take 1 tablet (40 mg total) by mouth daily at 6 PM.  . BRILINTA 90 MG TABS tablet TAKE ONE TABLET BY MOUTH TWICE DAILY  . metoprolol tartrate (LOPRESSOR) 25 MG  tablet TAKE ONE TABLET BY MOUTH TWICE DAILY  . nitroGLYCERIN (NITROSTAT) 0.4 MG SL tablet Place 1 tablet (0.4 mg total) under the tongue every 5 (five) minutes x 3 doses as needed for chest pain.  . ranitidine (ZANTAC) 150 MG tablet Take 150 mg by mouth at bedtime.  . valsartan (DIOVAN) 40 MG tablet Take 1 tablet (40 mg total) by mouth daily.  . [DISCONTINUED] aspirin EC 81 MG EC tablet Take 1 tablet (81 mg total) by mouth daily.     Allergies:   Patient has no known allergies.   Social History   Tobacco Use  . Smoking status: Never Smoker  . Smokeless tobacco: Never Used  Substance Use Topics  . Alcohol use: No  . Drug use: No     Family Hx: The patient's family history includes Heart attack (age of onset:  77) in his mother; Heart attack (age of onset: 18) in his father.   Prior CV studies:   The following studies were reviewed today: . none  Labs/Other Tests and Data Reviewed:    EKG:  No ECG reviewed.  Recent Labs: 12/30/2017: Hemoglobin 15.7; Platelets 216 07/27/2018: ALT 38; BUN 10; Creatinine, Ser 1.04; Potassium 4.4; Sodium 138   Recent Lipid Panel Lab Results  Component Value Date   CHOL 109 07/27/2018   HDL 47 07/27/2018   LDLCALC 47 07/27/2018   TRIG 75 07/27/2018   CHOLHDL 2.3 07/27/2018    Wt Readings from Last 3 Encounters:  12/26/18 255 lb (115.7 kg)  07/18/18 253 lb 6.4 oz (114.9 kg)  03/08/18 236 lb 3.2 oz (107.1 kg)     Objective:    Vital Signs:  BP 134/74   Pulse 60   Ht 5\' 11"  (1.803 m)   Wt 255 lb (115.7 kg)   BMI 35.57 kg/m   VITAL SIGNS:  reviewed GEN:  no acute distress RESPIRATORY:  non-labored NEURO:  A&O x 3 PSYCH:  normal affect  ASSESSMENT & PLAN:    Problem List Items Addressed This Visit    S/P CABG x 5 - Primary (Chronic)    He is now 14 years post CABG and 1 year status post cath showing 2 occluded vein grafts resulting in PCI of the native RCA.  Downstream targets are not very large.  Would not be that unexpected for  his RCA distribution discharged on totally.  This would go along with the fact that the right posterior lateral system and a large circumflex are both now non-revascularized and therefore EF is reduced with anatomically correct wall motion normality.  Would only repeat Myoview now if symptoms were to warrant.      Relevant Orders   Comprehensive metabolic panel   Obesity (BMI 30-39.9) (Chronic)    Encouraged increased exercise and diet.  He is gained quite a bit because of the summer followed by COVID-19 restrictions.  Is hoping to get back into the gym soon.      Ischemic cardiomyopathy (Chronic)    Stable finding on echocardiogram that goes along with a non-revascularized territory.  At this point no significant heart failure symptoms.  Remains on stable dose of beta-blocker & ARB, but does not require any diuretic as he is euvolemic.      Relevant Orders   Comprehensive metabolic panel   Essential hypertension (Chronic)    Blood pressure actually is pretty good.  Given that he has had some labile nature to his pressures, I would not push any further. Continue low-dose of the Lopressor and valsartan.      Dyslipidemia, goal LDL below 70 (Chronic)    Lipids look pretty well from well-controlled in January.  Continue 40 mg Lipitor and dietary modification.  Recheck lipids and chemistry panel.      Relevant Orders   Lipid panel   Comprehensive metabolic panel   Atherosclerotic heart disease of native coronary artery without angina pectoris (Chronic)    Significant native coronary disease as well as graft disease.  Has not had any further anginal symptoms since PCI of the RCA.  The downstream distribution of the RCA is relatively small.  I personally reviewed the images.  He asked about the downstream blockages, and I simply think that they are too small doing about.  No PCI option.  Remains relatively stable now along with statin, beta-blocker and ARB.  He is still on  aspirin and  Brilinta because of this DES stent.  We will switch to Brilinta alone, stop aspirin.    Also at this point.  Okay to hold Brilinta for procedures 5-7 days preop.   If he does complain of bruising, I would switch him to 60 mg twice daily Brilinta.      Relevant Orders   Lipid panel   Comprehensive metabolic panel      [id number] Education: The signs and symptoms of COVID-19 were discussed with the patient and how to seek care for testing (follow up with PCP or arrange E-visit).   The importance of social distancing was discussed today.  Time:   Today, I have spent 20 minutes with the patient with telehealth technology discussing the above problems.     Medication Adjustments/Labs and Tests Ordered: Current medicines are reviewed at length with the patient today.  Concerns regarding medicines are outlined above.  Medication Instructions:   OK to STOP Aspirin  Tests Ordered: Orders Placed This Encounter  Procedures  . Lipid panel  . Comprehensive metabolic panel   FLP, CMP in Jan 2021 prior to f/u   Medication Changes: No orders of the defined types were placed in this encounter. see below  Disposition:  Follow up in 6 month(s)    Signed, Glenetta Hew, MD  12/26/2018 11:16 PM    [place]

## 2018-12-26 NOTE — Assessment & Plan Note (Addendum)
Lipids look pretty well from well-controlled in January.  Continue 40 mg Lipitor and dietary modification.  Recheck lipids and chemistry panel.

## 2018-12-26 NOTE — Assessment & Plan Note (Signed)
He is now 14 years post CABG and 1 year status post cath showing 2 occluded vein grafts resulting in PCI of the native RCA.  Downstream targets are not very large.  Would not be that unexpected for his RCA distribution discharged on totally.  This would go along with the fact that the right posterior lateral system and a large circumflex are both now non-revascularized and therefore EF is reduced with anatomically correct wall motion normality.  Would only repeat Myoview now if symptoms were to warrant.

## 2018-12-26 NOTE — Telephone Encounter (Signed)
Spoke with patient - instruction given from televisit - avs summary will be mailed . Patient verbalized understanding.

## 2018-12-26 NOTE — Assessment & Plan Note (Signed)
Significant native coronary disease as well as graft disease.  Has not had any further anginal symptoms since PCI of the RCA.  The downstream distribution of the RCA is relatively small.  I personally reviewed the images.  He asked about the downstream blockages, and I simply think that they are too small doing about.  No PCI option.  Remains relatively stable now along with statin, beta-blocker and ARB.  He is still on aspirin and Brilinta because of this DES stent.  We will switch to Brilinta alone, stop aspirin.    Also at this point.  Okay to hold Brilinta for procedures 5-7 days preop.   If he does complain of bruising, I would switch him to 60 mg twice daily Brilinta.

## 2018-12-27 ENCOUNTER — Other Ambulatory Visit: Payer: Self-pay | Admitting: Cardiology

## 2019-02-05 ENCOUNTER — Other Ambulatory Visit: Payer: Self-pay

## 2019-02-26 ENCOUNTER — Other Ambulatory Visit: Payer: Self-pay | Admitting: Cardiology

## 2019-06-02 ENCOUNTER — Other Ambulatory Visit: Payer: Self-pay | Admitting: Cardiology

## 2019-06-06 ENCOUNTER — Other Ambulatory Visit: Payer: Self-pay | Admitting: Cardiology

## 2019-07-09 ENCOUNTER — Other Ambulatory Visit: Payer: Self-pay

## 2019-07-09 ENCOUNTER — Ambulatory Visit (INDEPENDENT_AMBULATORY_CARE_PROVIDER_SITE_OTHER): Payer: PPO | Admitting: Cardiology

## 2019-07-09 ENCOUNTER — Encounter: Payer: Self-pay | Admitting: Cardiology

## 2019-07-09 VITALS — BP 133/85 | HR 64 | Temp 96.8°F | Ht 71.0 in | Wt 270.0 lb

## 2019-07-09 DIAGNOSIS — I2510 Atherosclerotic heart disease of native coronary artery without angina pectoris: Secondary | ICD-10-CM | POA: Diagnosis not present

## 2019-07-09 DIAGNOSIS — Z951 Presence of aortocoronary bypass graft: Secondary | ICD-10-CM | POA: Diagnosis not present

## 2019-07-09 DIAGNOSIS — I214 Non-ST elevation (NSTEMI) myocardial infarction: Secondary | ICD-10-CM | POA: Diagnosis not present

## 2019-07-09 DIAGNOSIS — E785 Hyperlipidemia, unspecified: Secondary | ICD-10-CM

## 2019-07-09 DIAGNOSIS — I10 Essential (primary) hypertension: Secondary | ICD-10-CM

## 2019-07-09 DIAGNOSIS — E669 Obesity, unspecified: Secondary | ICD-10-CM | POA: Diagnosis not present

## 2019-07-09 DIAGNOSIS — I255 Ischemic cardiomyopathy: Secondary | ICD-10-CM

## 2019-07-09 NOTE — Progress Notes (Signed)
Primary Care Provider: Street, [name], MD Cardiologist: Glenetta Hew, MD Electrophysiologist:   Clinic Note: Chief Complaint  Patient presents with  . Follow-up    6 months  . Coronary Artery Disease    No active angina     HPI:    Jeremy [name] is a 69 y.o. male with a PMH below who presents today for 66-month follow-up for CAD   MV CAD on Cath 2006 -- CABG X 5  Nov 12, 2017 -admitted for non-STEMI -->catheter revealed occluded SVG-OM with recently occluded SVG-rPAV -->PCI to native RCA with DES. EF 40 and 45% with basal-mid inferior inferolateral akinesis.  Jeremy [name] was last seen via telemedicine in June 2020.  No major complaints.  Active with no cardiac issues.  Only issue was that he was gaining weight.  Recent Hospitalizations: None  Reviewed  CV studies:    The following studies were reviewed today: (if available, images/films reviewed: From Epic Chart or Care Everywhere) . None since May 2019  Interval History:   ALBON BOGDANSKI returns here today overall doing fairly well.  He does do quite a bit of walking but no regular exercise.  He also does yard work.  Despite this, he has gained quite a bit of weight during the COVID-19 lockdown timeframe.  He knows he needs to make changes in his diet and increase his exercise going forward into the next year.  Otherwise, he is doing well without any cardiac issues.  No angina or heart failure symptoms.  CV Review of Symptoms (Summary) Cardiovascular ROS: no chest pain or dyspnea on exertion negative for - edema, irregular heartbeat, orthopnea, palpitations, paroxysmal nocturnal dyspnea, rapid heart rate, shortness of breath or Syncope/near syncope, TIA/amaurosis fugax, claudication  The patient does not have symptoms concerning for COVID-19 infection (fever, chills, cough, or new shortness of breath).  The patient is practicing social distancing. ++ Masking.  ++ Groceries/shopping.   REVIEWED OF SYSTEMS    A comprehensive ROS was performed. Review of Systems  Constitutional: Negative for malaise/fatigue and weight loss.  HENT: Negative for congestion and nosebleeds.   Gastrointestinal: Negative for blood in stool and melena.  Genitourinary: Negative for hematuria.  Musculoskeletal: Positive for joint pain (Chronic Hip and knee pain.).  Neurological: Negative for dizziness and weakness.  Endo/Heme/Allergies: Negative for environmental allergies.  Psychiatric/Behavioral: The patient does not have insomnia.   All other systems reviewed and are negative.  I have reviewed and (if needed) personally updated the patient's problem list, medications, allergies, past medical and surgical history, social and family history.   PAST MEDICAL HISTORY   Past Medical History:  Diagnosis Date  . Bifascicular bundle branch block 11/2017   RBBB & LAFB  . CAD (coronary artery disease), autologous vein bypass graft 11/2017   100% CTO SVG-OM2 & new 100% SVG-rPAV. --> PCI of native RCA  . CAD in native artery 03/2005   a) pLAD 90&95%, mid 75%, D1 & D2 - patent; Cx-OM1 ok, OM2 100%, distal Cx mild disease; RCA 50-60% mid, 75% distal) --CABG X 5;; b) 11/2017 - PCI -99% mRCA w. residual 99% rPDA & 100% CTO PAV  . History of kidney stones   . HLD (hyperlipidemia)   . HTN (hypertension), benign   . Non-ST elevation myocardial infarction (NSTEMI), subendocardial infarction (Jeremy [name]) 03/2005, 11/2017   a) CATH with MV CAD --> CABG x 4; b) 11/14/17 PCI/DES (Resolute Onyx 4.5 x 26) to mRCA, 2/4 patent grafts (LIMA-LAD, SVG-Diag). EF 40-45%.   [name]  Obesity (BMI 30.0-34.9)   . 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 APPENDECTOMY    . CORONARY ARTERY BYPASS GRAFT  SEPT 2006   CABGX 5: fr LIMA-LAD, fr RIMA-OM1, SVG-D1 , seq SVG-PDA/PLA   . CORONARY STENT INTERVENTION N/A 11/14/2017   Procedure: CORONARY STENT  INTERVENTION;  Surgeon: [name], MD;  Location: [place] [place] [facility];:: DES PCI mRCA: Resolute Onyx DES 4.5 mm x 26 mm (4.6 mm);   [name] HERNIA REPAIR    . LEFT HEART CATH AND CORONARY ANGIOGRAPHY  Sept 7, 2006   requiring CABG; (pLAD 90&95%, mid 75%, D1 & D2 - patent; Cx-OM1 ok, OM2 100%, distal Cx mild disease; RCA 50-60% mid, 75% distal)  . LEFT HEART CATH AND CORS/GRAFTS ANGIOGRAPHY N/A 11/14/2017   Procedure: LEFT HEART CATH AND CORS/GRAFTS ANGIOGRAPHY;  Surgeon: [name], MD;  Location: [place] [facility]:: Ost-p LAD 99% before Mod D1 -->Patent LIMA-mLAD & SVG-D1. Ost-P Cx 40%, OM1 50%, OM2 100% CTO --> RIMA-OM2 100% CTO. mRCA 99% (small rPA 99% & rPAV 100% CTO); LVEDP 20 mmHg --> SVG-rPAV 100% (? Culptrit);;  --> PCI RCA  . [name] SINGLE W/SPECT  Oct 2011   LOW RISK: and no significant ischemia  . TONSILLECTOMY    . TRANSTHORACIC ECHOCARDIOGRAM  NOV 2012   EF 50 TO 55%,NO WALL MOTION ABNORM   . TRANSTHORACIC ECHOCARDIOGRAM  11/2017   EF 40 and 45%.  GR 1 DD.  Akinesis of the mid inferior and inferolateral wall.  Relatively normal valves.     Cath-Post PCI   Nov 14, 2017:  DES PCI mRCA: Resolute Onyx DES 4.5 mm x 26 mm (4.6 mm); LVEDP 20 mmHg   MEDICATIONS/ALLERGIES   Current Meds  Medication Sig  . BRILINTA 90 MG TABS tablet TAKE ONE TABLET BY MOUTH TWICE DAILY  . metoprolol tartrate (LOPRESSOR) 25 MG tablet TAKE ONE TABLET BY MOUTH TWICE DAILY  . nitroGLYCERIN (NITROSTAT) 0.4 MG SL tablet Place 1 tablet (0.4 mg total) under the tongue every 5 (five) minutes x 3 doses as needed for chest pain.  . valsartan (DIOVAN) 40 MG tablet Take 1 tablet (40 mg total) by mouth daily.  . [DISCONTINUED] atorvastatin (LIPITOR) 40 MG tablet TAKE ONE TABLET BY MOUTH EVERY DAY AT 6PM  . [DISCONTINUED] ranitidine (ZANTAC) 150 MG tablet Take 150 mg by mouth at bedtime.    No Known Allergies   SOCIAL HISTORY/FAMILY HISTORY   Social History   Tobacco Use  .  Smoking status: Never Smoker  . Smokeless tobacco: Never Used  Substance Use Topics  . Alcohol use: No  . Drug use: No   Social History   Social History Narrative   Married father of one. 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  Works full-time at [employer].(12 hr shifts)   Never smoked.     Walks daily for ~30 min/day.      Family History family history includes Heart attack (age of onset: 8) in his mother; Heart attack (age of onset: 34) in his father.   OBJCTIVE -PE, EKG, labs   Wt Readings from Last 3 Encounters:  07/09/19 270 lb (122.5 kg)  12/26/18 255 lb (115.7 kg)  07/18/18 253 lb 6.4 oz (114.9 kg)    Physical Exam: BP 133/85   Pulse 64   Temp (!) 96.8 F (36 C)  Ht 5\' 11"  (1.803 m)   Wt 270 lb (122.5 kg)   SpO2 96%   BMI 37.66 kg/m  Physical Exam  Constitutional: He is oriented to person, place, and time. He appears well-developed and well-nourished. No distress.  Moderate severely obese.  Well-groomed.  HENT:  Head: Normocephalic and atraumatic.  Neck: No hepatojugular reflux and no JVD present. Carotid bruit is not present.  Cardiovascular: Normal rate, regular rhythm and intact distal pulses.  No extrasystoles are present. PMI is not displaced. Exam reveals no gallop and no friction rub.  No murmur heard. Pulmonary/Chest: Effort normal and breath sounds normal. No respiratory distress. He has no wheezes. He has no rales.  Abdominal: Soft. Bowel sounds are normal. He exhibits no distension. There is no abdominal tenderness. There is no rebound.  Musculoskeletal:        General: No edema. Normal range of motion.     Cervical back: Normal range of motion and neck supple.  Neurological: He is alert and oriented to person, place, and time.  Psychiatric: He has a normal mood and affect. His behavior is normal. Judgment and thought content normal.  Vitals reviewed.    Adult ECG Report  Rate: 60 ;  Rhythm: normal sinus rhythm and RBBB & Left Axis  Deviation (-84).  ;   Narrative Interpretation: c/w prior - Inferolateral TWI no longer present.  Recent Labs:  - due to have labs Lab Results  Component Value Date   CHOL 145 07/09/2019   HDL 52 07/09/2019   LDLCALC 72 07/09/2019   TRIG 114 07/09/2019   CHOLHDL 2.8 07/09/2019   Lab Results  Component Value Date   CREATININE 1.14 07/09/2019   BUN 15 07/09/2019   NA 140 07/09/2019   K 4.4 07/09/2019   CL 104 07/09/2019   CO2 21 07/09/2019    ASSESSMENT/PLAN   Problem List Items Addressed This Visit    Atherosclerotic heart disease of native coronary artery without angina pectoris - Primary (Chronic)    Multivessel native disease with occluded vein grafts to the distal RCA and OM.  Now status post PCI to the native RCA but there is downstream RCA disease as well in the PDA and occluded PL system.  The LIMA LAD and SVG to diagonal are both widely patent.  No active anginal symptoms since his last PCI.  He is on beta-blocker, ARB and statin  Remains on Brilinta alone at 90 mg.  Next visit we will reduce to 60 mg twice daily --Okay to hold Brilinta 5-7 days preop for procedures or operations.       Relevant Orders   EKG 12-Lead (Completed)   PSA (Completed)   Lipid panel (Completed)   Comprehensive metabolic panel (Completed)   Hemoglobin A1c (Completed)   CBC (Completed)   S/P CABG x 5 (Chronic)    Last heart cath was in 2019.  Would probably wait till 2023 or 2020 for recheck Myoview.  Known 2 out of 4 grafts closed.      Relevant Orders   EKG 12-Lead (Completed)   PSA (Completed)   Comprehensive metabolic panel (Completed)   Hemoglobin A1c (Completed)   CBC (Completed)   Dyslipidemia, goal LDL below 70 (Chronic)    He just had lipids done today (have been ordered to be done today prior to visit).  Reviewed above.  LDL pretty much at goal.  Continue current dose of Lipitor.  Hopefully with weight loss and diet, he can get below 70 if not  closer to 16.       Relevant Orders   PSA (Completed)   Lipid panel (Completed)   Comprehensive metabolic panel (Completed)   Hemoglobin A1c (Completed)   CBC (Completed)   Essential hypertension (Chronic)    Borderline blood pressure today on current dose of Lopressor and losartan.  Monitor closely as he has had some labile pressures in the past.      Relevant Orders   EKG 12-Lead (Completed)   Morbid obesity ([place]) (Chronic)    He now knows that he needs to pay attention to what he is eating and increase his level of exercise and the post Covid timeframe.  Needs to figure out something to do with the gym being closed.      Relevant Orders   PSA (Completed)   Lipid panel (Completed)   Comprehensive metabolic panel (Completed)   Hemoglobin A1c (Completed)   CBC (Completed)   Ischemic cardiomyopathy (Chronic)    On stable dose of beta-blocker and ARB.  Euvolemic without diuretic.  In the absence of symptoms, not seeing reason to reevaluate with echocardiogram.  I suspect that his EF is improved.      Relevant Orders   EKG 12-Lead (Completed)   PSA (Completed)   Comprehensive metabolic panel (Completed)   Hemoglobin A1c (Completed)   CBC (Completed)   NSTEMI (non-ST elevated myocardial infarction) [facility])    He had a non-STEMI in May 2019 found to have occluded graft to the RCA with PCI to the native RCA.  He did have some mildly reduced EF but no active heart failure symptoms and no further angina.      Relevant Orders   EKG 12-Lead (Completed)   PSA (Completed)   Lipid panel (Completed)   Hemoglobin A1c (Completed)   CBC (Completed)     As part of his general health maintenance, we are checking lipid panel.  To avoid excess blood draws we will go and check a CBC, hemoglobin A1c, and PSA (per patient request)  COVID-19 Education: The signs and symptoms of COVID-19 were discussed with the patient and how to seek care for testing (follow up with PCP or arrange E-visit).   The importance of  social distancing was discussed today.  I spent a total of 55minutes with the patient and chart review. >  50% of the time was spent in direct patient consultation.  Additional time spent with chart review (studies, outside notes, etc): 8 Total Time: 26 min   Current medicines are reviewed at length with the patient today.  (+/- concerns) n/a   Patient Instructions / Medication Changes & Studies & Tests Ordered   Patient Instructions  Medication Instructions:  no changes  *If you need a refill on your cardiac medications before your next appointment, please call your pharmacy*  Lab Work: Lipid CMP Cbc PSA hgbA1C If you have labs (blood work) drawn today and your tests are completely normal, you will receive your results only by: [name] MyChart Message (if you have MyChart) OR . A paper copy in the mail If you have any lab test that is abnormal or we need to change your treatment, we will call you to review the results.  Testing/Procedures: Not needed  Follow-Up: At [facility], you and your health needs are our priority.  As part of our continuing mission to provide you with exceptional heart care, we have created designated Provider Care Teams.  These Care Teams include your primary Cardiologist (physician) and Advanced Practice Providers (APPs -  Physician Assistants and Nurse Practitioners) who all work together to provide you with the care you need, when you need it.  Your next appointment:   12 month(s)  The format for your next appointment:   In Person  Provider:   Glenetta Hew, MD    Studies Ordered:   Orders Placed This Encounter  Procedures  . PSA  . Lipid panel  . Comprehensive metabolic panel  . Hemoglobin A1c  . CBC  . EKG 12-Lead     Glenetta Hew, M.D., M.S. Interventional Cardiologist   Pager # [phone] Phone # [phone] [address] [place], [place] [address]   Thank you for choosing Heartcare at [facility]!!

## 2019-07-09 NOTE — Patient Instructions (Signed)
Medication Instructions:  no changes  *If you need a refill on your cardiac medications before your next appointment, please call your pharmacy*  Lab Work: Lipid CMP Cbc PSA hgbA1C If you have labs (blood work) drawn today and your tests are completely normal, you will receive your results only by: [name] MyChart Message (if you have MyChart) OR . A paper copy in the mail If you have any lab test that is abnormal or we need to change your treatment, we will call you to review the results.  Testing/Procedures: Not needed  Follow-Up: At [facility], you and your health needs are our priority.  As part of our continuing mission to provide you with exceptional heart care, we have created designated Provider Care Teams.  These Care Teams include your primary Cardiologist (physician) and Advanced Practice Providers (APPs -  Physician Assistants and Nurse Practitioners) who all work together to provide you with the care you need, when you need it.  Your next appointment:   12 month(s)  The format for your next appointment:   In Person  Provider:   Glenetta Hew, MD

## 2019-07-10 LAB — LIPID PANEL
Chol/HDL Ratio: 2.8 ratio (ref 0.0–5.0)
Cholesterol, Total: 145 mg/dL (ref 100–199)
HDL: 52 mg/dL (ref >39)
LDL Chol Calc (NIH): 72 mg/dL (ref 0–99)
Triglycerides: 114 mg/dL (ref 0–149)
VLDL Cholesterol Cal: 21 mg/dL (ref 5–40)

## 2019-07-10 LAB — COMPREHENSIVE METABOLIC PANEL
ALT: 27 IU/L (ref 0–44)
AST: 36 IU/L (ref 0–40)
Albumin/Globulin Ratio: 1.5 (ref 1.2–2.2)
Albumin: 4.4 g/dL (ref 3.8–4.8)
Alkaline Phosphatase: 119 IU/L — ABNORMAL HIGH (ref 39–117)
BUN/Creatinine Ratio: 13 (ref 10–24)
BUN: 15 mg/dL (ref 8–27)
Bilirubin Total: 1.4 mg/dL — ABNORMAL HIGH (ref 0.0–1.2)
CO2: 21 mmol/L (ref 20–29)
Calcium: 9.8 mg/dL (ref 8.6–10.2)
Chloride: 104 mmol/L (ref 96–106)
Creatinine, Ser: 1.14 mg/dL (ref 0.76–1.27)
GFR calc Af Amer: 77 mL/min/{1.73_m2} (ref >59)
GFR calc non Af Amer: 66 mL/min/{1.73_m2} (ref >59)
Globulin, Total: 2.9 g/dL (ref 1.5–4.5)
Glucose: 70 mg/dL (ref 65–99)
Potassium: 4.4 mmol/L (ref 3.5–5.2)
Sodium: 140 mmol/L (ref 134–144)
Total Protein: 7.3 g/dL (ref 6.0–8.5)

## 2019-07-10 LAB — CBC
Hematocrit: 47.8 % (ref 37.5–51.0)
Hemoglobin: 16.6 g/dL (ref 13.0–17.7)
MCH: 32.1 pg (ref 26.6–33.0)
MCHC: 34.7 g/dL (ref 31.5–35.7)
MCV: 93 fL (ref 79–97)
Platelets: 215 10*3/uL (ref 150–450)
RBC: 5.17 x10E6/uL (ref 4.14–5.80)
RDW: 12.3 % (ref 11.6–15.4)
WBC: 7.5 10*3/uL (ref 3.4–10.8)

## 2019-07-10 LAB — PSA: Prostate Specific Ag, Serum: 1 ng/mL (ref 0.0–4.0)

## 2019-07-10 LAB — HEMOGLOBIN A1C
Est. average glucose Bld gHb Est-mCnc: 123 mg/dL
Hgb A1c MFr Bld: 5.9 % — ABNORMAL HIGH (ref 4.8–5.6)

## 2019-07-13 ENCOUNTER — Telehealth: Payer: Self-pay | Admitting: *Deleted

## 2019-07-13 ENCOUNTER — Encounter: Payer: Self-pay | Admitting: *Deleted

## 2019-07-13 ENCOUNTER — Other Ambulatory Visit: Payer: Self-pay | Admitting: *Deleted

## 2019-07-13 DIAGNOSIS — E785 Hyperlipidemia, unspecified: Secondary | ICD-10-CM

## 2019-07-13 DIAGNOSIS — I2510 Atherosclerotic heart disease of native coronary artery without angina pectoris: Secondary | ICD-10-CM

## 2019-07-13 MED ORDER — ATORVASTATIN CALCIUM 20 MG PO TABS: ORAL_TABLET | ORAL | 3 refills | Status: DC

## 2019-07-13 NOTE — Telephone Encounter (Signed)
-----   Message from Leonie Man, MD sent at 07/10/2019  6:20 PM EST ----- Relatively normal PSA.  Pretty much stable from last year.  Cholesterol level has slightly increased total cholesterol level, but pretty much at goal HDL.  LDL is just above our target.  I would like to see if he can take the atorvastatin 2 tablets a day for 2 days a week.  Then recheck fasting lipid panel in 6 months.  Glenetta Hew, MD

## 2019-07-13 NOTE — Telephone Encounter (Signed)
The patient has been notified of the result and verbalized understanding.  All questions (if any) were answered. Patient request a copy of labs.  Letter sent with result and labslip Raiford Simmonds, RN 07/13/2019 9:33 AM

## 2019-07-13 NOTE — Telephone Encounter (Signed)
Hemoglobin A1c stable from 4 years ago at 5.9. Normal range is less than 5.6, this is borderline for diabetes.   The patient has been notified of the result and verbalized understanding.  All questions (if any) were answered. Raiford Simmonds, RN 07/13/2019 9:47 AM

## 2019-07-16 ENCOUNTER — Encounter: Payer: Self-pay | Admitting: Cardiology

## 2019-07-16 NOTE — Assessment & Plan Note (Addendum)
He just had lipids done today (have been ordered to be done today prior to visit).  Reviewed above.  LDL pretty much at goal.  Continue current dose of Lipitor.  Hopefully with weight loss and diet, he can get below 70 if not closer to 55.

## 2019-07-16 NOTE — Assessment & Plan Note (Addendum)
Multivessel native disease with occluded vein grafts to the distal RCA and OM.  Now status post PCI to the native RCA but there is downstream RCA disease as well in the PDA and occluded PL system.  The LIMA LAD and SVG to diagonal are both widely patent.  No active anginal symptoms since his last PCI.  He is on beta-blocker, ARB and statin  Remains on Brilinta alone at 90 mg.  Next visit we will reduce to 60 mg twice daily --Okay to hold Brilinta 5-7 days preop for procedures or operations.

## 2019-07-16 NOTE — Assessment & Plan Note (Signed)
He now knows that he needs to pay attention to what he is eating and increase his level of exercise and the post Covid timeframe.  Needs to figure out something to do with the gym being closed.

## 2019-07-16 NOTE — Assessment & Plan Note (Signed)
On stable dose of beta-blocker and ARB.  Euvolemic without diuretic.  In the absence of symptoms, not seeing reason to reevaluate with echocardiogram.  I suspect that his EF is improved.

## 2019-07-16 NOTE — Assessment & Plan Note (Signed)
He had a non-STEMI in May 2019 found to have occluded graft to the RCA with PCI to the native RCA.  He did have some mildly reduced EF but no active heart failure symptoms and no further angina.

## 2019-07-16 NOTE — Assessment & Plan Note (Signed)
Borderline blood pressure today on current dose of Lopressor and losartan.  Monitor closely as he has had some labile pressures in the past.

## 2019-07-16 NOTE — Assessment & Plan Note (Signed)
Last heart cath was in 2019.  Would probably wait till 2023 or 2020 for recheck Myoview.  Known 2 out of 4 grafts closed.

## 2019-09-11 ENCOUNTER — Other Ambulatory Visit: Payer: Self-pay

## 2019-09-11 MED ORDER — METOPROLOL TARTRATE 25 MG PO TABS: 25.0000 mg | ORAL_TABLET | Freq: Two times a day (BID) | ORAL | 10 refills | Status: DC

## 2019-10-16 DIAGNOSIS — J029 Acute pharyngitis, unspecified: Secondary | ICD-10-CM | POA: Diagnosis not present

## 2019-10-16 DIAGNOSIS — J101 Influenza due to other identified influenza virus with other respiratory manifestations: Secondary | ICD-10-CM | POA: Diagnosis not present

## 2019-10-16 DIAGNOSIS — B349 Viral infection, unspecified: Secondary | ICD-10-CM | POA: Diagnosis not present

## 2019-10-16 DIAGNOSIS — J020 Streptococcal pharyngitis: Secondary | ICD-10-CM | POA: Diagnosis not present

## 2019-10-25 DIAGNOSIS — Z961 Presence of intraocular lens: Secondary | ICD-10-CM | POA: Diagnosis not present

## 2019-11-08 ENCOUNTER — Other Ambulatory Visit: Payer: Self-pay | Admitting: Cardiology

## 2020-01-31 ENCOUNTER — Other Ambulatory Visit: Payer: Self-pay | Admitting: Cardiology

## 2020-07-08 ENCOUNTER — Ambulatory Visit (INDEPENDENT_AMBULATORY_CARE_PROVIDER_SITE_OTHER): Payer: PPO | Admitting: Cardiology

## 2020-07-08 ENCOUNTER — Other Ambulatory Visit: Payer: Self-pay

## 2020-07-08 ENCOUNTER — Encounter: Payer: Self-pay | Admitting: Cardiology

## 2020-07-08 VITALS — BP 121/78 | HR 49 | Ht 71.0 in | Wt 281.6 lb

## 2020-07-08 DIAGNOSIS — I2510 Atherosclerotic heart disease of native coronary artery without angina pectoris: Secondary | ICD-10-CM

## 2020-07-08 DIAGNOSIS — I214 Non-ST elevation (NSTEMI) myocardial infarction: Secondary | ICD-10-CM | POA: Diagnosis not present

## 2020-07-08 DIAGNOSIS — Z951 Presence of aortocoronary bypass graft: Secondary | ICD-10-CM | POA: Diagnosis not present

## 2020-07-08 DIAGNOSIS — I10 Essential (primary) hypertension: Secondary | ICD-10-CM | POA: Diagnosis not present

## 2020-07-08 DIAGNOSIS — E785 Hyperlipidemia, unspecified: Secondary | ICD-10-CM | POA: Diagnosis not present

## 2020-07-08 DIAGNOSIS — R001 Bradycardia, unspecified: Secondary | ICD-10-CM

## 2020-07-08 DIAGNOSIS — Z955 Presence of coronary angioplasty implant and graft: Secondary | ICD-10-CM | POA: Diagnosis not present

## 2020-07-08 DIAGNOSIS — I255 Ischemic cardiomyopathy: Secondary | ICD-10-CM

## 2020-07-08 MED ORDER — TICAGRELOR 60 MG PO TABS: 60.0000 mg | ORAL_TABLET | Freq: Two times a day (BID) | ORAL | 3 refills | Status: DC

## 2020-07-08 NOTE — Progress Notes (Signed)
Primary Care Provider: Street, [name], MD Cardiologist: Glenetta Hew, MD Electrophysiologist: None  Clinic Note: Chief Complaint  Patient presents with   Coronary Artery Disease    No angina   Follow-up    Annual   Cardiomyopathy    Ischemic   Hyperlipidemia    Labs due today.   ASSESSMENT/PLAN   Problem List Items Addressed This Visit    Atherosclerotic heart disease of native coronary artery without angina pectoris - Primary (Chronic)    No further angina since RCA PCI in 2019. He now has follow-up at the 2nd OM and distal PL system revascularized (as a result of occluded vein graft to the RPL and 2nd OM). No active or ongoing angina.  Plan:   Continue current dose of valsartan and Lopressor-(if we reduce Lopressor dose with bradycardia, will increase valsartan dose to 80 mg.  Continue atorvastatin at current dose for now pending reevaluation of lipids.  Reducing the dose of Brilinta to 60 mg twice daily maintenance  Okay to hold Brilinta 5 days preop for surgery procedures.       Relevant Orders   EKG 12-Lead (Completed)   Lipid panel (Completed)   Comprehensive metabolic panel (Completed)   CBC (Completed)   S/P CABG x 5 (Chronic)    Last ischemic evaluation was May 2019 with cardiac catheterization and PCI. Would not be due for follow-up stress test until 2013.      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  He is now 2-1/2 years out from PCI--plan is to reduce dose to Brilinta 60 mg twice daily maintenance dosing, without aspirin --> Okay to hold Brilinta 5 days preop for surgeries or procedures.      Dyslipidemia, goal LDL below 70 (Chronic)    Due to have lipids checked today. Remains on atorvastatin 20 mg. Last year lipids were close to goal.  (On review of labs checked-LDL was 86. Not at goal. Would like to increase to 40 mg atorvastatin, if not tolerated, will  switch to rosuvastatin 40 mg. -> Recheck labs in 3 to 4 months..      Relevant Orders   EKG 12-Lead (Completed)   Lipid panel (Completed)   Comprehensive metabolic panel (Completed)   CBC (Completed)   Essential hypertension (Chronic)    Blood pressure stable on current meds. If we reduce beta-blocker, would have to increase ARB dose.      Relevant Orders   EKG 12-Lead (Completed)   Sinus bradycardia by electrocardiogram (Chronic)    Rate is 49 date. Usually uses in the 52s and 60s. He is not showing any signs of excess fatigue to suggest chronotropic incompetence.  For now continue current dose of Lopressor 25 mg twice daily, but low threshold to reduce dose in which case would probably have to increase valsartan dose.      Morbid obesity ([place]) (Chronic)    The patient understands the need to lose weight with diet and exercise. We have discussed specific strategies for this.      Relevant Orders   EKG 12-Lead (Completed)   NSTEMI (non-ST elevated myocardial infarction) ([place]) (Chronic)    Last cardiac event was a non-STEMI in May 2019 where he is found to have an occluded vein graft to the RCA. Native RCA treated PCI. Mild EF reduction, but no recurrent anginal heart failure symptoms.      Relevant Orders  EKG 12-Lead (Completed)   Ischemic cardiomyopathy (Chronic)    Mildly reduced EF of 40 to 45% with mid inferior inferolateral wall akinesis following his most recent MI. He is not having any active CHF symptoms PND orthopnea or edema.   NYHA class I-2 symptoms.He is euvolemic without diuretic.   Plan: Continue current dose of ARB and beta-blocker for now. No diuretic requirement.       Relevant Orders   EKG 12-Lead (Completed)     ====================================  HPI:    Jeremy [name] is a 70 y.o. male with a PMH notable for CAD-CABG and PCI, HLD, HTN who presents today for essentially 65-month follow-up.   MV CAD on Cath 2006 -- CABG X 5  Nov 12, 2017  -admitted for non-STEMI -->catheter revealed occluded SVG-OM with recently occluded SVG-rPAV -->PCI to native RCA with DES. EF 40 and 45% with basal-mid inferior inferolateral akinesis.   Jeremy [name] was last seen via telemedicine on December 26, 2018 -> no major complaints. Very active. No active cardiac issues. No bleeding or bruising issues. Lipids are pretty well controlled. Positive medications. No fatigue or exercise intolerance. Very active.  Recent Hospitalizations: None  Reviewed  CV studies:    The following studies were reviewed today: (if available, images/films reviewed: From Epic Chart or Care Everywhere)  None:  Interval History:   Jeremy [name] returns here today for delayed annual follow-up with no major issues. He denies any active ongoing cardiac symptoms. His heart rate here today is 49 bpm, but he says that usually at home rates are in the 50s to 60s, and he does not have any signs or symptoms of [place] incontinence. No fatigue, or exercise intolerance. He is very active still walking, but he is now a little bit deconditioned and now pretty much up morbidly obese. His hips and knees are bothering him.  CV Review of Symptoms (Summary): positive for - Some exertional dyspnea because of deconditioning and hip/knee pain. Trivial edema. negative for - chest pain, irregular heartbeat, orthopnea, palpitations, paroxysmal nocturnal dyspnea, rapid heart rate, shortness of breath or Syncope/near syncope, TIA/amaurosis fugax, claudication  The patient does not have symptoms concerning for COVID-19 infection (fever, chills, cough, or new shortness of breath).   REVIEWED OF SYSTEMS   Review of Systems  Constitutional: Negative for malaise/fatigue and weight loss (Actually gained weight.).  HENT: Negative for congestion and nosebleeds.   Respiratory: Negative for shortness of breath.   Gastrointestinal: Negative for blood in stool and melena.  Genitourinary: Negative for  hematuria.  Musculoskeletal: Positive for back pain and joint pain (Hips and knees).  Neurological: Negative for dizziness and weakness.  Endo/Heme/Allergies: Does not bruise/bleed easily.  Psychiatric/Behavioral: Negative for memory loss. The patient is not nervous/anxious and does not have insomnia.    I have reviewed and (if needed) personally updated the patient's problem list, medications, allergies, past medical and surgical history, social and family history.   PAST MEDICAL HISTORY   Past Medical History:  Diagnosis Date   Bifascicular bundle branch block 11/2017   RBBB & LAFB   CAD (coronary artery disease), autologous vein bypass graft 11/2017   100% CTO SVG-OM2 & new 100% SVG-rPAV. --> PCI of native RCA   CAD in native artery 03/2005   a) pLAD 90&95%, mid 75%, D1 & D2 - patent; Cx-OM1 ok, OM2 100%, distal Cx mild disease; RCA 50-60% mid, 75% distal) --CABG X 5;; b) 11/2017 - PCI -99% mRCA w. residual 99% rPDA &  100% CTO PAV   History of kidney stones    HLD (hyperlipidemia)    HTN (hypertension), benign    Non-ST elevation myocardial infarction (NSTEMI), subendocardial infarction ([place]) 03/2005, 11/2017   a) CATH with MV CAD --> CABG x 4; b) 11/14/17 PCI/DES (Resolute Onyx 4.5 x 26) to mRCA, 2/4 patent grafts (LIMA-LAD, SVG-Diag). EF 40-45%.    Obesity (BMI 30.0-34.9)    S/P CABG x 5 03/2005   frLIMA-LAD, frRIMA-OM1, SVG-D1, seq SVG-rPDA-RPL    PAST SURGICAL HISTORY   Past Surgical History:  Procedure Laterality Date   APPENDECTOMY     CORONARY ARTERY BYPASS GRAFT  SEPT 2006   [place] 5: fr LIMA-LAD, fr RIMA-OM1, SVG-D1 , seq SVG-PDA/PLA    CORONARY STENT INTERVENTION N/A 11/14/2017   Procedure: CORONARY STENT INTERVENTION;  Surgeon: [name], MD;  Location: [facility];:: DES PCI mRCA: Resolute Onyx DES 4.5 mm x 26 mm (4.6 mm);    HERNIA REPAIR     LEFT HEART CATH AND CORONARY ANGIOGRAPHY  Sept 7, 2006   requiring CABG; (pLAD 90&95%, mid  75%, D1 & D2 - patent; Cx-OM1 ok, OM2 100%, distal Cx mild disease; RCA 50-60% mid, 75% distal)   LEFT HEART CATH AND CORS/GRAFTS ANGIOGRAPHY N/A 11/14/2017   Procedure: LEFT HEART CATH AND CORS/GRAFTS ANGIOGRAPHY;  Surgeon: [name], MD;  Location: [facility]:: Ost-p LAD 99% before Mod D1 -->Patent LIMA-mLAD & SVG-D1. Ost-P Cx 40%, OM1 50%, OM2 100% CTO --> RIMA-OM2 100% CTO. mRCA 99% (small rPA 99% & rPAV 100% CTO); LVEDP 20 mmHg --> SVG-rPAV 100% (? Culptrit);;  --> PCI RCA   [facility] SINGLE W/SPECT  Oct 2011   LOW RISK: and no significant ischemia   TONSILLECTOMY     TRANSTHORACIC ECHOCARDIOGRAM  NOV 2012   EF 50 TO 55%,NO WALL MOTION ABNORM    TRANSTHORACIC ECHOCARDIOGRAM  11/2017   EF 40 and 45%.  GR 1 DD.  Akinesis of the mid inferior and inferolateral wall.  Relatively normal valves.     Cath-Post PCI Nov 14, 2017: DES PCI mRCA: Resolute Onyx DES 4.5 mm x 26 mm (4.6 mm); LVEDP 20 mmHg     There is no immunization history on file for this patient.  MEDICATIONS/ALLERGIES   Current Meds  Medication Sig   atorvastatin (LIPITOR) 20 MG tablet Take 20 mg tablet by mouth daily except  2 days a week take  40 mg  ( total of( 2) 20 mg tablets).   metoprolol tartrate (LOPRESSOR) 25 MG tablet Take 1 tablet (25 mg total) by mouth 2 (two) times daily.   nitroGLYCERIN (NITROSTAT) 0.4 MG SL tablet Place 1 tablet (0.4 mg total) under the tongue every 5 (five) minutes x 3 doses as needed for chest pain.   ticagrelor (BRILINTA) 60 MG TABS tablet Take 1 tablet (60 mg total) by mouth 2 (two) times daily.   valsartan (DIOVAN) 40 MG tablet Take 1 tablet (40 mg total) by mouth daily.   [DISCONTINUED] BRILINTA 90 MG TABS tablet TAKE ONE TABLET BY MOUTH TWICE DAILY    No Known Allergies  SOCIAL HISTORY/FAMILY HISTORY   Reviewed in Epic:  Pertinent findings:  Social History   Tobacco Use   Smoking status: Never Smoker   Smokeless tobacco: Never Used   Substance Use Topics   Alcohol use: No   Drug use: No   Social History   Social History Narrative   Married father of one. Lives and works  [place], [place].  Works full-time at [employer].(12 hr shifts)   Never smoked.     Walks daily for ~30 min/day.      OBJCTIVE -PE, EKG, labs   Wt Readings from Last 3 Encounters:  07/08/20 281 lb 9.6 oz (127.7 kg)  07/09/19 270 lb (122.5 kg)  12/26/18 255 lb (115.7 kg)    Physical Exam: BP 121/78    Pulse (!) 49    Ht 5\' 11"  (1.803 m)    Wt 281 lb 9.6 oz (127.7 kg)    SpO2 97%    BMI 39.28 kg/m  Physical Exam Vitals reviewed.  Constitutional:      General: He is not in acute distress.    Appearance: Normal appearance. He is not ill-appearing (Well-groomed) or toxic-appearing.     Comments: Morbidly obese  HENT:     Head: Normocephalic and atraumatic.  Neck:     Vascular: No carotid bruit, hepatojugular reflux or JVD.  Cardiovascular:     Rate and Rhythm: Regular rhythm. Bradycardia present.  No extrasystoles are present.    Chest Wall: PMI is not displaced.     Pulses: Normal pulses and intact distal pulses. No decreased pulses (Difficult to palpate because of body habitus, but palpable).     Heart sounds: S1 normal. Heart sounds are distant. No murmur heard. No friction rub. No gallop.      Comments: Split S2 Pulmonary:     Effort: Pulmonary effort is normal. No respiratory distress.     Breath sounds: Normal breath sounds.     Comments: Somewhat distant breath sounds Chest:     Chest wall: No tenderness.  Musculoskeletal:        General: No swelling. Normal range of motion.     Cervical back: Normal range of motion and neck supple.  Neurological:     General: No focal deficit present.     Mental Status: He is alert and oriented to person, place, and time.  Psychiatric:        Mood and Affect: Mood normal.        Behavior: Behavior normal.        Thought Content: Thought content normal.        Judgment:  Judgment normal.     Adult ECG Report  Rate: 49 ;  Rhythm: sinus bradycardia and RBBB, left axis deviation.;   Narrative Interpretation: Stable EKG.  Recent Labs: Labs from today reviewed Lab Results  Component Value Date   CHOL 159 07/08/2020   HDL 48 07/08/2020   LDLCALC 86 07/08/2020   TRIG 143 07/08/2020   CHOLHDL 3.3 07/08/2020   Lab Results  Component Value Date   CREATININE 1.24 07/08/2020   BUN 10 07/08/2020   NA 141 07/08/2020   K 5.1 07/08/2020   CL 104 07/08/2020   CO2 24 07/08/2020   CBC Latest Ref Rng & Units 07/08/2020 07/09/2019 12/30/2017  WBC 3.4 - 10.8 x10E3/uL 7.9 7.5 6.7  Hemoglobin 13.0 - 17.7 g/dL 16.7 16.6 15.7  Hematocrit 37.5 - 51.0 % 46.5 47.8 44.7  Platelets 150 - 450 x10E3/uL 237 215 216    Lab Results  Component Value Date   TSH 4.256 04/23/2014    =========================   COVID-19 Education: The signs and symptoms of COVID-19 were discussed with the patient and how to seek care for testing (follow up with PCP or arrange E-visit).   The importance of social distancing and COVID-19 vaccination was discussed today. The patient is practicing  social distancing & Masking.   I spent a total of 34 minutes with the patient spent in direct patient consultation.  Additional time spent with chart review  / charting (studies, outside notes, etc): 15 min Total Time: 49 min   Current medicines are reviewed at length with the patient today.  (+/- concerns) n/a  This visit occurred during the SARS-CoV-2 public health emergency.  Safety protocols were in place, including screening questions prior to the visit, additional usage of staff PPE, and extensive cleaning of exam room while observing appropriate contact time as indicated for disinfecting solutions.  Notice: This dictation was prepared with Dragon dictation along with smaller phrase technology. Any transcriptional errors that result from this process are unintentional and may not be corrected upon  review.  Patient Instructions / Medication Changes & Studies & Tests Ordered   Patient Instructions  Medication Instructions:  DECREASE brilinta to 60mg  twice daily *If you need a refill on your cardiac medications before your next appointment, please call your pharmacy*   Lab Work: Lipid Panel, CBC, CMET today   If you have labs (blood work) drawn today and your tests are completely normal, you will receive your results only by:  MyChart Message (if you have MyChart) OR  A paper copy in the mail If you have any lab test that is abnormal or we need to change your treatment, we will call you to review the results.   Testing/Procedures: NONE   Follow-Up: At [facility], you and your health needs are our priority.  As part of our continuing mission to provide you with exceptional heart care, we have created designated Provider Care Teams.  These Care Teams include your primary Cardiologist (physician) and Advanced Practice Providers (APPs -  Physician Assistants and Nurse Practitioners) who all work together to provide you with the care you need, when you need it.  We recommend signing up for the patient portal called "MyChart".  Sign up information is provided on this After Visit Summary.  MyChart is used to connect with patients for Virtual Visits (Telemedicine).  Patients are able to view lab/test results, encounter notes, upcoming appointments, etc.  Non-urgent messages can be sent to your provider as well.   To learn more about what you can do with MyChart, go to [facility].    Your next appointment:   12 month(s)  The format for your next appointment:   In Person  Provider:   You may see [email or web address], MD or one of the following Advanced Practice Providers on your designated Care Team:    Bryan Lemma, PA-C  Theodore Demark, DNP, ANP    Other Instructions Please monitor your heart rate at home -- if you have consistent readings under 40, please  decrease your beta-blocker     Studies Ordered:   Orders Placed This Encounter  Procedures   Lipid panel   Comprehensive metabolic panel   CBC   EKG 12-Lead     Joni Reining, M.D., M.S. Interventional Cardiologist   Pager # [phone] Phone # [phone] [address] [place], [place]   Thank you for choosing Heartcare at [facility]!!

## 2020-07-08 NOTE — Patient Instructions (Signed)
Medication Instructions:  DECREASE brilinta to 60mg  twice daily *If you need a refill on your cardiac medications before your next appointment, please call your pharmacy*   Lab Work: Lipid Panel, CBC, CMET today   If you have labs (blood work) drawn today and your tests are completely normal, you will receive your results only by: MyChart Message (if you have MyChart) OR . A paper copy in the mail If you have any lab test that is abnormal or we need to change your treatment, we will call you to review the results.   Testing/Procedures: NONE   Follow-Up: At [facility], you and your health needs are our priority.  As part of our continuing mission to provide you with exceptional heart care, we have created designated Provider Care Teams.  These Care Teams include your primary Cardiologist (physician) and Advanced Practice Providers (APPs -  Physician Assistants and Nurse Practitioners) who all work together to provide you with the care you need, when you need it.  We recommend signing up for the patient portal called "MyChart".  Sign up information is provided on this After Visit Summary.  MyChart is used to connect with patients for Virtual Visits (Telemedicine).  Patients are able to view lab/test results, encounter notes, upcoming appointments, etc.  Non-urgent messages can be sent to your provider as well.   To learn more about what you can do with MyChart, go to [facility].    Your next appointment:   12 month(s)  The format for your next appointment:   In Person  Provider:   You may see [email or web address], MD or one of the following Advanced Practice Providers on your designated Care Team:    Bryan Lemma, PA-C  Theodore Demark, DNP, ANP    Other Instructions Please monitor your heart rate at home -- if you have consistent readings under 40, please decrease your beta-blocker

## 2020-07-09 LAB — LIPID PANEL
Chol/HDL Ratio: 3.3 ratio (ref 0.0–5.0)
Cholesterol, Total: 159 mg/dL (ref 100–199)
HDL: 48 mg/dL (ref >39)
LDL Chol Calc (NIH): 86 mg/dL (ref 0–99)
Triglycerides: 143 mg/dL (ref 0–149)
VLDL Cholesterol Cal: 25 mg/dL (ref 5–40)

## 2020-07-09 LAB — CBC
Hematocrit: 46.5 % (ref 37.5–51.0)
Hemoglobin: 16.7 g/dL (ref 13.0–17.7)
MCH: 32.9 pg (ref 26.6–33.0)
MCHC: 35.9 g/dL — ABNORMAL HIGH (ref 31.5–35.7)
MCV: 92 fL (ref 79–97)
Platelets: 237 10*3/uL (ref 150–450)
RBC: 5.08 x10E6/uL (ref 4.14–5.80)
RDW: 12.4 % (ref 11.6–15.4)
WBC: 7.9 10*3/uL (ref 3.4–10.8)

## 2020-07-09 LAB — COMPREHENSIVE METABOLIC PANEL
ALT: 28 IU/L (ref 0–44)
AST: 27 IU/L (ref 0–40)
Albumin/Globulin Ratio: 1.2 (ref 1.2–2.2)
Albumin: 4.1 g/dL (ref 3.8–4.8)
Alkaline Phosphatase: 116 IU/L (ref 44–121)
BUN/Creatinine Ratio: 8 — ABNORMAL LOW (ref 10–24)
BUN: 10 mg/dL (ref 8–27)
Bilirubin Total: 0.9 mg/dL (ref 0.0–1.2)
CO2: 24 mmol/L (ref 20–29)
Calcium: 9.7 mg/dL (ref 8.6–10.2)
Chloride: 104 mmol/L (ref 96–106)
Creatinine, Ser: 1.24 mg/dL (ref 0.76–1.27)
GFR calc Af Amer: 69 mL/min/{1.73_m2} (ref >59)
GFR calc non Af Amer: 59 mL/min/{1.73_m2} — ABNORMAL LOW (ref >59)
Globulin, Total: 3.3 g/dL (ref 1.5–4.5)
Glucose: 86 mg/dL (ref 65–99)
Potassium: 5.1 mmol/L (ref 3.5–5.2)
Sodium: 141 mmol/L (ref 134–144)
Total Protein: 7.4 g/dL (ref 6.0–8.5)

## 2020-07-14 ENCOUNTER — Encounter: Payer: Self-pay | Admitting: Cardiology

## 2020-07-14 DIAGNOSIS — R001 Bradycardia, unspecified: Secondary | ICD-10-CM | POA: Insufficient documentation

## 2020-07-14 NOTE — Assessment & Plan Note (Addendum)
Blood pressure stable on current meds. If we reduce beta-blocker, would have to increase ARB dose.

## 2020-07-14 NOTE — Assessment & Plan Note (Signed)
Mildly reduced EF of 40 to 45% with mid inferior inferolateral wall akinesis following his most recent MI. He is not having any active CHF symptoms PND orthopnea or edema.   NYHA class I-2 symptoms.He is euvolemic without diuretic.   Plan: Continue current dose of ARB and beta-blocker for now. No diuretic requirement.

## 2020-07-14 NOTE — Assessment & Plan Note (Signed)
The patient understands the need to lose weight with diet and exercise. We have discussed specific strategies for this.  

## 2020-07-14 NOTE — Assessment & Plan Note (Addendum)
Last ischemic evaluation was May 2019 with cardiac catheterization and PCI. Would not be due for follow-up stress test until 2013.

## 2020-07-14 NOTE — Assessment & Plan Note (Signed)
Rate is 49 date. Usually uses in the 94s and 60s. He is not showing any signs of excess fatigue to suggest chronotropic incompetence.  For now continue current dose of Lopressor 25 mg twice daily, but low threshold to reduce dose in which case would probably have to increase valsartan dose.

## 2020-07-14 NOTE — Assessment & Plan Note (Signed)
Due to have lipids checked today. Remains on atorvastatin 20 mg. Last year lipids were close to goal.  (On review of labs checked-LDL was 86. Not at goal. Would like to increase to 40 mg atorvastatin, if not tolerated, will switch to rosuvastatin 40 mg. -> Recheck labs in 3 to 4 months.[name]

## 2020-07-14 NOTE — Progress Notes (Signed)
Cholesterol levels are not as well-controlled as they were last year. LDL is at 86 and total cholesterol up to 159. HDL still within goal and triglycerides are still okay. Would like to increase to 40 mg atorvastatin, . -> Recheck labs in 3 to 4 months..  (If atorvastatin dose of 40 mg is not tolerated, would switch to rosuvastatin).  Chemistry panel looks pretty good. Renal function is just a little bit on the low end of normal, but otherwise stable. Normal liver function.  Glenetta Hew, MD

## 2020-07-14 NOTE — Assessment & Plan Note (Signed)
Has been on full dose Brilinta 90 mg twice daily.  He is now 2-1/2 years out from PCI--plan is to reduce dose to Brilinta 60 mg twice daily maintenance dosing, without aspirin --> Okay to hold Brilinta 5 days preop for surgeries or procedures.

## 2020-07-14 NOTE — Assessment & Plan Note (Signed)
No further angina since RCA PCI in 2019. He now has follow-up at the 2nd OM and distal PL system revascularized (as a result of occluded vein graft to the RPL and 2nd OM). No active or ongoing angina.  Plan:   Continue current dose of valsartan and Lopressor-(if we reduce Lopressor dose with bradycardia, will increase valsartan dose to 80 mg.  Continue atorvastatin at current dose for now pending reevaluation of lipids.  Reducing the dose of Brilinta to 60 mg twice daily maintenance  Okay to hold Brilinta 5 days preop for surgery procedures.

## 2020-07-14 NOTE — Assessment & Plan Note (Signed)
Last cardiac event was a non-STEMI in May 2019 where he is found to have an occluded vein graft to the RCA. Native RCA treated PCI. Mild EF reduction, but no recurrent anginal heart failure symptoms.

## 2020-07-29 ENCOUNTER — Telehealth: Payer: Self-pay | Admitting: Cardiology

## 2020-07-29 DIAGNOSIS — E785 Hyperlipidemia, unspecified: Secondary | ICD-10-CM

## 2020-07-29 MED ORDER — ATORVASTATIN CALCIUM 40 MG PO TABS: ORAL_TABLET | ORAL | 2 refills | Status: DC

## 2020-07-29 NOTE — Telephone Encounter (Signed)
Spoke with patient and reviewed recent lab work and recommendations. New prescription sent to pharmacy. Lab orders and recent note mailed to patient. Patient verbalized understanding.

## 2020-07-29 NOTE — Telephone Encounter (Signed)
Patient is requesting to discuss his most recent lab results. Please call.

## 2020-09-04 ENCOUNTER — Other Ambulatory Visit: Payer: Self-pay | Admitting: Cardiology

## 2020-10-03 ENCOUNTER — Other Ambulatory Visit: Payer: Self-pay | Admitting: Cardiology

## 2021-04-28 ENCOUNTER — Other Ambulatory Visit: Payer: Self-pay | Admitting: Cardiology

## 2021-06-02 DIAGNOSIS — E669 Obesity, unspecified: Secondary | ICD-10-CM | POA: Diagnosis not present

## 2021-06-02 DIAGNOSIS — Z131 Encounter for screening for diabetes mellitus: Secondary | ICD-10-CM | POA: Diagnosis not present

## 2021-06-02 DIAGNOSIS — M79672 Pain in left foot: Secondary | ICD-10-CM | POA: Diagnosis not present

## 2021-06-18 DIAGNOSIS — M7672 Peroneal tendinitis, left leg: Secondary | ICD-10-CM | POA: Diagnosis not present

## 2021-06-18 DIAGNOSIS — M7752 Other enthesopathy of left foot: Secondary | ICD-10-CM | POA: Diagnosis not present

## 2021-06-28 ENCOUNTER — Other Ambulatory Visit: Payer: Self-pay | Admitting: Cardiology

## 2021-07-21 DIAGNOSIS — M7672 Peroneal tendinitis, left leg: Secondary | ICD-10-CM | POA: Diagnosis not present

## 2021-07-21 DIAGNOSIS — M778 Other enthesopathies, not elsewhere classified: Secondary | ICD-10-CM | POA: Diagnosis not present

## 2021-09-01 ENCOUNTER — Other Ambulatory Visit: Payer: Self-pay

## 2021-09-01 MED ORDER — METOPROLOL TARTRATE 25 MG PO TABS: 25.0000 mg | ORAL_TABLET | Freq: Two times a day (BID) | ORAL | 3 refills | Status: DC

## 2021-09-01 NOTE — Progress Notes (Signed)
This was the note from pharmacy. [This prescription was filled on 08/12/2020. Any refills authorized will be placed on file.] But no indication that Metoprolol was refilled. Metoprolol sent to [facility] from paper refill.

## 2021-09-10 ENCOUNTER — Other Ambulatory Visit: Payer: Self-pay | Admitting: Cardiology

## 2021-09-28 ENCOUNTER — Other Ambulatory Visit: Payer: Self-pay

## 2021-09-28 ENCOUNTER — Encounter: Payer: Self-pay | Admitting: Cardiology

## 2021-09-28 ENCOUNTER — Ambulatory Visit: Payer: PPO | Admitting: Cardiology

## 2021-09-28 VITALS — BP 134/76 | HR 55 | Ht 71.0 in | Wt 292.8 lb

## 2021-09-28 DIAGNOSIS — R001 Bradycardia, unspecified: Secondary | ICD-10-CM

## 2021-09-28 DIAGNOSIS — Z955 Presence of coronary angioplasty implant and graft: Secondary | ICD-10-CM

## 2021-09-28 DIAGNOSIS — I2510 Atherosclerotic heart disease of native coronary artery without angina pectoris: Secondary | ICD-10-CM | POA: Diagnosis not present

## 2021-09-28 DIAGNOSIS — Z951 Presence of aortocoronary bypass graft: Secondary | ICD-10-CM

## 2021-09-28 DIAGNOSIS — R7303 Prediabetes: Secondary | ICD-10-CM

## 2021-09-28 DIAGNOSIS — I214 Non-ST elevation (NSTEMI) myocardial infarction: Secondary | ICD-10-CM

## 2021-09-28 DIAGNOSIS — I10 Essential (primary) hypertension: Secondary | ICD-10-CM | POA: Diagnosis not present

## 2021-09-28 DIAGNOSIS — E785 Hyperlipidemia, unspecified: Secondary | ICD-10-CM

## 2021-09-28 DIAGNOSIS — D751 Secondary polycythemia: Secondary | ICD-10-CM | POA: Diagnosis not present

## 2021-09-28 DIAGNOSIS — I255 Ischemic cardiomyopathy: Secondary | ICD-10-CM

## 2021-09-28 NOTE — Patient Instructions (Signed)
Medication Instructions:  ? NO CHANGES ?*If you need a refill on your cardiac medications before your next appointment, please call your pharmacy* ? ? ?Lab Work: fasting ?CMP ?LIPID ?CBC ?HgbA1c ?TSH ?PSA ?If you have labs (blood work) drawn today and your tests are completely normal, you will receive your results only by: ?MyChart Message (if you have MyChart) OR ?A paper copy in the mail ?If you have any lab test that is abnormal or we need to change your treatment, we will call you to review the results. ? ? ?Testing/Procedures: ?Not needed ? ? ?Follow-Up: ?At [facility], you and your health needs are our priority.  As part of our continuing mission to provide you with exceptional heart care, we have created designated Provider Care Teams.  These Care Teams include your primary Cardiologist (physician) and Advanced Practice Providers (APPs -  Physician Assistants and Nurse Practitioners) who all work together to provide you with the care you need, when you need it. ? ?We recommend signing up for the patient portal called "MyChart".  Sign up information is provided on this After Visit Summary.  MyChart is used to connect with patients for Virtual Visits (Telemedicine).  Patients are able to view lab/test results, encounter notes, upcoming appointments, etc.  Non-urgent messages can be sent to your provider as well.   ?To learn more about what you can do with MyChart, go to [email or web address].   ? ?Your next appointment:   ?12 month(s) ? ?The format for your next appointment:   ?In Person ? ?Provider:   ?Glenetta Hew, MD   ? ? ? ?

## 2021-09-28 NOTE — Progress Notes (Signed)
? ? ?Primary Care Provider: Street, [name], MD ?Cardiologist: Glenetta Hew, MD ?Electrophysiologist: None ? ?Clinic Note: ?Chief Complaint  ?Patient presents with  ? Follow-up  ?  Delayed annual.  Due for labs  ? Coronary Artery Disease  ?  No angina  ? Cardiomyopathy  ?  No CHF symptoms.  ? ?=================================== ? ?ASSESSMENT/PLAN  ? ?Problem List Items Addressed This Visit   ? ?  ? Cardiology Problems  ? Atherosclerotic heart disease of native coronary artery without angina pectoris - Primary (Chronic)  ?  No further angina since PCI in the setting of non-STEMI. ?Basically occluded RPL, OM 2 along with severe subtotal occlusion of the PDA.  Patent grafts to D1 and LAD with occluded grafts to the distal RCA and OM 2. ? ?Not symptomatic.  Has not had an ischemic evaluation since last cath. ?Is almost 4 years out from surgery.  Would be due to order Myoview and echocardiogram at next visit. ? ?Plan: Continue current dose of beta-blocker, ARB and statin. ?On maintenance dose Brilinta. ?  ?  ? Relevant Orders  ? EKG 12-Lead (Completed)  ? Hemoglobin A1c  ? Lipid panel  ? Comprehensive metabolic panel  ? TSH  ? PSA  ? CBC  ? Dyslipidemia, goal LDL below 70 (Chronic)  ?  Labs checked last year showed LDL of 86.  Not quite where we would like to be.  He is due for labs recheck now. ?Labs ordered to be done this month. ? ?If not at goal, would probably need to consider additional therapy either with ezetimibe/Nexletol or potentially PCSK9 inhibitor.. ? ? ?  ?  ? Relevant Orders  ? EKG 12-Lead (Completed)  ? Hemoglobin A1c  ? Lipid panel  ? Comprehensive metabolic panel  ? TSH  ? PSA  ? CBC  ? Essential hypertension (Chronic)  ?  Blood pressure is pretty stable on current dose of Lopressor and Diovan. ?If additional blood pressure control is required, would probably titrate up Diovan. ? ?  ?  ? Relevant Orders  ? EKG 12-Lead (Completed)  ? Hemoglobin A1c  ? Lipid panel  ? Comprehensive metabolic  panel  ? TSH  ? PSA  ? CBC  ? NSTEMI (non-ST elevated myocardial infarction) (HCC) (Chronic)  ?  Most recent non-STEMI was in 2019 -> associated with reduced EF and basal inferior akinesis.  Have not recheck an echocardiogram, but not have any heart failure symptoms. ?Unfortunately, with being retired, he just gained weight.  This has made him more deconditioned. ?On minimal medications including low-dose Lopressor and Diovan, moderate dose atorvastatin ?Also on maintenance dose Brilinta. ? ?  ?  ? Relevant Orders  ? EKG 12-Lead (Completed)  ? Hemoglobin A1c  ? Lipid panel  ? Comprehensive metabolic panel  ? TSH  ? PSA  ? CBC  ? Ischemic cardiomyopathy (Chronic)  ?  EF at time of recent MI was 40 to 45% with mid inferior and inferolateral akinesis.  No CHF or anginal symptoms.  No PND orthopnea or edema.  NYHA class I-II symptoms depending on whether his exertional dyspnea is considered CHF versus of obesity and deconditioning.  Euvolemic on exam without diuretic. ? ?All stable dose of Lopressor and Diovan ? ?  ?  ? Relevant Orders  ? EKG 12-Lead (Completed)  ? Hemoglobin A1c  ? Lipid panel  ? Comprehensive metabolic panel  ? TSH  ? PSA  ? CBC  ?  ? Other  ? S/P CABG x  5 (Chronic)  ?  We will plan for follow-up Myoview and echocardiogram after next visit unless symptoms warrant sooner. ? ?  ?  ? Relevant Orders  ? EKG 12-Lead (Completed)  ? Hemoglobin A1c  ? Lipid panel  ? Comprehensive metabolic panel  ? TSH  ? PSA  ? CBC  ? 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  2 and half years post PCI to native RCA.  Also existing disease noted. ?On maintenance dose Brilinta with no bleeding issues. => Okay to hold Brilinta 5-7 days preop for surgical procedures. ?Check CBC ?  ?  ? Relevant Orders  ? EKG 12-Lead (Completed)  ? Hemoglobin A1c  ? Lipid panel  ? Comprehensive metabolic panel  ? TSH  ? PSA  ? CBC  ? Sinus bradycardia by electrocardiogram (Chronic)  ?  Stable rate.  No signs of  chronotropic incompetence.  Unable to titrate further ? ?  ?  ? Morbid obesity ([place]) (Chronic)  ?  Unfortunately, he has become more sedentary since being retired.  We stressed the importance of not only dietary modification but also increasing his exercise level. ? ?Needs to figure out some type of exercise program. ?Discussed DASHand Mediterranean diets as model diets.-Mostly decreased portion size and less grazing. ? ?  ?  ? Relevant Orders  ? EKG 12-Lead (Completed)  ? Hemoglobin A1c  ? Lipid panel  ? Comprehensive metabolic panel  ? TSH  ? PSA  ? CBC  ? Polycythemia  ?  History of high H&H.  We will check CBC and TSH. ? ?  ?  ? Prediabetes (Chronic)  ?  We will check A1c along with CMP and lipids ? ?  ?  ? Relevant Orders  ? EKG 12-Lead (Completed)  ? Hemoglobin A1c  ? Lipid panel  ? Comprehensive metabolic panel  ? TSH  ? PSA  ? CBC  ? ? ?=================================== ? ?HPI:   ? ?Jeremy [name] is a 71 y.o. male with a PMH notable for multivessel CAD-CABG as well as PCI, mild ICM (EF 40 to 45%) ,HLD, HTN who presents today for delayed annual follow-up. ? ?MV CAD on Cath 2006 -- CABG X 5 ?Nov 12, 2017 -admitted for non-STEMI --> Cath: CTO SVG-OM with recently occluded SVG-rPAV --> PCI to native RCA with DES.  EF 40 and 45% with basal-mid inferior inferolateral akinesis. ?Echo; (11/14/2017): EF 40 to 45%.  GR 1 DD. Basal and mid inferior-inferolateral akinesis. ? ?Jeremy [name] was last seen on July 08, 2020 -> No major complaints.  No acute chronotropic incompetence symptoms despite having bradycardia.  No fatigue.  Was trying to stay active with walking but was somewhat deconditioned.  Limited by hip and knee pain. => No changes made.  Labs ordered. ? ?Recent Hospitalizations: None ? ?Reviewed  CV studies:   ? ?The following studies were reviewed today: (if available, images/films reviewed: From Epic Chart or Care Everywhere) ?None: ? ?Interval History:  ? ?Jeremy [name] returns today for delayed annual  follow-up doing pretty well.  He is retired now, so he is not as active -> As such, he has gained about 10 pounds since last visit.  He is trying to get some exercise now but acknowledges that he is probably not as active as he should be.  Somewhat limited by hip and knee pain. ? ?Not really any fatigue symptoms.  No real CHF or arrhythmia symptoms. ? ?CV Review of Symptoms (  Summary) ?Cardiovascular ROS: positive for - dyspnea on exertion and deconditioned - mild SOB b/c wgt; No sign of chronotropic incompetence. ?negative for - chest pain, edema, irregular heartbeat, orthopnea, palpitations, paroxysmal nocturnal dyspnea, rapid heart rate, shortness of breath, or syncope/near synceop, TIA/amaurosis fugax, claudication ? ?REVIEWED OF SYSTEMS  ? ?Review of Systems  ?Constitutional:  Negative for malaise/fatigue (Just deconditioned) and weight loss (Gain more weight).  ?HENT:  Negative for nosebleeds.   ?Respiratory:  Positive for shortness of breath (Mild exertional dyspnea from deconditioning). Negative for cough.   ?Cardiovascular:   ?     Per HPI  ?Gastrointestinal:  Negative for blood in stool and melena.  ?Genitourinary:  Negative for hematuria.  ?Musculoskeletal:  Positive for back pain and joint pain (Knees and hips).  ?Neurological:  Negative for dizziness, focal weakness, loss of consciousness and headaches.  ?Endo/Heme/Allergies:  Does not bruise/bleed easily.  ?Psychiatric/Behavioral: Negative.    ? ?I have reviewed and (if needed) personally updated the patient's problem list, medications, allergies, past medical and surgical history, social and family history.  ? ?PAST MEDICAL HISTORY  ? ?Past Medical History:  ?Diagnosis Date  ? Bifascicular bundle branch block 11/2017  ? RBBB & LAFB  ? CAD (coronary artery disease), autologous vein bypass graft 11/2017  ? 100% CTO SVG-OM2 & new 100% SVG-rPAV. --> PCI of native RCA  ? CAD in native artery 03/2005  ? a) pLAD 90&95%, mid 75%, D1 & D2 - patent; Cx-OM1 ok,  OM2 100%, distal Cx mild disease; RCA 50-60% mid, 75% distal) --CABG X 5;; b) 11/2017 - PCI -99% mRCA w. residual 99% rPDA & 100% CTO PAV  ? History of kidney stones   ? HLD (hyperlipidemia)   ? HTN (hypertension),

## 2021-10-18 ENCOUNTER — Encounter: Payer: Self-pay | Admitting: Cardiology

## 2021-10-18 NOTE — Assessment & Plan Note (Signed)
Stable rate.  No signs of chronotropic incompetence.  Unable to titrate further ?

## 2021-10-18 NOTE — Assessment & Plan Note (Signed)
EF at time of recent MI was 40 to 45% with mid inferior and inferolateral akinesis.  No CHF or anginal symptoms.  No PND orthopnea or edema.  NYHA class I-II symptoms depending on whether his exertional dyspnea is considered CHF versus of obesity and deconditioning.  Euvolemic on exam without diuretic. ? ?All stable dose of Lopressor and Diovan ?

## 2021-10-18 NOTE — Assessment & Plan Note (Addendum)
Most recent non-STEMI was in 2019 -> associated with reduced EF and basal inferior akinesis.  Have not recheck an echocardiogram, but not have any heart failure symptoms. ?Unfortunately, with being retired, he just gained weight.  This has made him more deconditioned. ?On minimal medications including low-dose Lopressor and Diovan, moderate dose atorvastatin ?Also on maintenance dose Brilinta. ?

## 2021-10-18 NOTE — Assessment & Plan Note (Addendum)
Unfortunately, he has become more sedentary since being retired.  We stressed the importance of not only dietary modification but also increasing his exercise level. ? ?Needs to figure out some type of exercise program. ?Discussed DASHand Mediterranean diets as model diets.-Mostly decreased portion size and less grazing. ?

## 2021-10-18 NOTE — Assessment & Plan Note (Addendum)
No further angina since PCI in the setting of non-STEMI. ?Basically occluded RPL, OM 2 along with severe subtotal occlusion of the PDA.  Patent grafts to D1 and LAD with occluded grafts to the distal RCA and OM 2. ? ?Not symptomatic.  Has not had an ischemic evaluation since last cath. ?Is almost 4 years out from surgery.  Would be due to order Myoview and echocardiogram at next visit. ? ?Plan: Continue current dose of beta-blocker, ARB and statin. ?? On maintenance dose Brilinta. ?

## 2021-10-18 NOTE — Assessment & Plan Note (Signed)
Labs checked last year showed LDL of 86.  Not quite where we would like to be.  He is due for labs recheck now. ?Labs ordered to be done this month. ? ?If not at goal, would probably need to consider additional therapy either with ezetimibe/Nexletol or potentially PCSK9 inhibitor.. ? ?

## 2021-10-18 NOTE — Assessment & Plan Note (Signed)
Blood pressure is pretty stable on current dose of Lopressor and Diovan. ?If additional blood pressure control is required, would probably titrate up Diovan. ?

## 2021-10-18 NOTE — Assessment & Plan Note (Signed)
History of high H&H.  We will check CBC and TSH. ?

## 2021-10-18 NOTE — Assessment & Plan Note (Signed)
We will check A1c along with CMP and lipids ?

## 2021-10-18 NOTE — Assessment & Plan Note (Signed)
We will plan for follow-up Myoview and echocardiogram after next visit unless symptoms warrant sooner. ?

## 2021-10-18 NOTE — Assessment & Plan Note (Addendum)
2 and half years post PCI to native RCA.  Also existing disease noted. ?? On maintenance dose Brilinta with no bleeding issues. => Okay to hold Brilinta 5-7 days preop for surgical procedures. ?? Check CBC ?

## 2022-03-12 ENCOUNTER — Telehealth: Payer: Self-pay | Admitting: Cardiology

## 2022-03-12 MED ORDER — ATORVASTATIN CALCIUM 40 MG PO TABS: ORAL_TABLET | ORAL | 2 refills | Status: DC

## 2022-03-12 NOTE — Telephone Encounter (Signed)
*  STAT* If patient is at the pharmacy, call can be transferred to refill team.   1. Which medications need to be refilled? (please list name of each medication and dose if known) atorvastatin (LIPITOR) 40 MG tablet  2. Which pharmacy/location (including street and city if local pharmacy) is medication to be sent to? [place], [place]  3. Do they need a 30 day or 90 day supply? [place]

## 2022-04-16 ENCOUNTER — Other Ambulatory Visit: Payer: Self-pay | Admitting: Cardiology

## 2022-07-26 ENCOUNTER — Telehealth: Payer: Self-pay

## 2022-07-26 NOTE — Patient Outreach (Signed)
  Care Coordination   07/26/2022 Name: Jeremy [name] MRN: [id number] DOB: [birth year]   Care Coordination Outreach Attempts:  An unsuccessful telephone outreach was attempted today to offer the patient information about available care coordination services as a benefit of their health plan.   Follow Up Plan:  Additional outreach attempts will be made to offer the patient care coordination information and services.   Encounter Outcome:  No Answer   Care Coordination Interventions:  No, not indicated    Tomasa Rand, RN, BSN, [facility] [employer] [phone]

## 2022-08-03 ENCOUNTER — Telehealth: Payer: Self-pay

## 2022-08-03 NOTE — Patient Outreach (Signed)
  Care Coordination   08/03/2022 Name: Jeremy [name] MRN: [id number] DOB: [birth year]   Care Coordination Outreach Attempts:  A second unsuccessful outreach was attempted today to offer the patient with information about available care coordination services as a benefit of their health plan.     Follow Up Plan:  Additional outreach attempts will be made to offer the patient care coordination information and services.   Encounter Outcome:  No Answer   Care Coordination Interventions:  No, not indicated    Tomasa Rand, RN, BSN, [facility] [employer] [phone]

## 2022-08-06 ENCOUNTER — Other Ambulatory Visit: Payer: Self-pay | Admitting: Cardiology

## 2022-08-10 ENCOUNTER — Telehealth: Payer: Self-pay

## 2022-08-10 NOTE — Patient Outreach (Signed)
  Care Coordination   08/10/2022 Name: ULISES WOLFINGER MRN: [id number] DOB: July 16, 1950   Care Coordination Outreach Attempts:  A third unsuccessful outreach was attempted today to offer the patient with information about available care coordination services as a benefit of their health plan.   Follow Up Plan:  No further outreach attempts will be made at this time. We have been unable to contact the patient to offer or enroll patient in care coordination services  Encounter Outcome:  No Answer   Care Coordination Interventions:  No, not indicated    Tomasa Rand, RN, BSN, [facility] [place] Coordinator [phone]

## 2022-08-23 ENCOUNTER — Other Ambulatory Visit: Payer: Self-pay

## 2022-08-23 MED ORDER — METOPROLOL TARTRATE 25 MG PO TABS: 25.0000 mg | ORAL_TABLET | Freq: Two times a day (BID) | ORAL | 3 refills | Status: DC

## 2022-09-21 ENCOUNTER — Other Ambulatory Visit: Payer: Self-pay | Admitting: Cardiology

## 2022-09-21 ENCOUNTER — Other Ambulatory Visit: Payer: Self-pay

## 2022-10-05 ENCOUNTER — Encounter: Payer: Self-pay | Admitting: Nurse Practitioner

## 2022-10-05 ENCOUNTER — Other Ambulatory Visit: Payer: Self-pay

## 2022-10-05 ENCOUNTER — Ambulatory Visit: Payer: PPO | Attending: Nurse Practitioner | Admitting: Nurse Practitioner

## 2022-10-05 VITALS — BP 128/84 | HR 54 | Ht 71.0 in | Wt 305.2 lb

## 2022-10-05 DIAGNOSIS — I2510 Atherosclerotic heart disease of native coronary artery without angina pectoris: Secondary | ICD-10-CM

## 2022-10-05 DIAGNOSIS — R001 Bradycardia, unspecified: Secondary | ICD-10-CM | POA: Diagnosis not present

## 2022-10-05 DIAGNOSIS — I10 Essential (primary) hypertension: Secondary | ICD-10-CM

## 2022-10-05 DIAGNOSIS — I214 Non-ST elevation (NSTEMI) myocardial infarction: Secondary | ICD-10-CM

## 2022-10-05 DIAGNOSIS — R7303 Prediabetes: Secondary | ICD-10-CM | POA: Diagnosis not present

## 2022-10-05 DIAGNOSIS — E785 Hyperlipidemia, unspecified: Secondary | ICD-10-CM

## 2022-10-05 DIAGNOSIS — Z955 Presence of coronary angioplasty implant and graft: Secondary | ICD-10-CM

## 2022-10-05 DIAGNOSIS — I255 Ischemic cardiomyopathy: Secondary | ICD-10-CM

## 2022-10-05 NOTE — Progress Notes (Signed)
Office Visit    Patient Name: JURIAH LANAHAN Date of Encounter: 10/05/2022  Primary Care Provider:  Street, [name], MD Primary Cardiologist:  Glenetta Hew, MD  Chief Complaint    72 year old male with a history of CAD s/p CABG x 5 (frLIMA-LAD, frRIMA-OM1, SVG-D1, seq SVG-rPDA-RPL) in 10-16-04, s/p DES-RCA in 10-16-17, ICM, bradycardia, hypertension, hyperlipidemia, prediabetes, polycythemia, and obesity who presents for follow-up related to CAD.  Past Medical History    Past Medical History:  Diagnosis Date   Bifascicular bundle branch block 11/2017   RBBB & LAFB   CAD (coronary artery disease), autologous vein bypass graft 11/2017   100% CTO SVG-OM2 & new 100% SVG-rPAV. --> PCI of native RCA   CAD in native artery 03/2005   a) pLAD 90&95%, mid 75%, D1 & D2 - patent; Cx-OM1 ok, OM2 100%, distal Cx mild disease; RCA 50-60% mid, 75% distal) --CABG X 5;; b) 11/2017 - PCI -99% mRCA w. residual 99% rPDA & 100% CTO PAV   History of kidney stones    HLD (hyperlipidemia)    HTN (hypertension), benign    Non-ST elevation myocardial infarction (NSTEMI), subendocardial infarction 03/2005, 11/2017   a) CATH with MV CAD --> CABG x 4; b) 11/14/17 PCI/DES (Resolute Onyx 4.5 x 26) to mRCA, 2/4 patent grafts (LIMA-LAD, SVG-Diag). EF 40-45%.    Obesity (BMI 30.0-34.9)    S/P CABG x 5 03/2005   frLIMA-LAD, frRIMA-OM1, SVG-D1, seq SVG-rPDA-RPL   Past Surgical History:  Procedure Laterality Date   APPENDECTOMY     CORONARY ARTERY BYPASS GRAFT  SEPT 2004-10-16   [place] 5: fr LIMA-LAD, fr RIMA-OM1, SVG-D1 , seq SVG-PDA/PLA    CORONARY STENT INTERVENTION N/A 11/14/2017   Procedure: CORONARY STENT INTERVENTION;  Surgeon: [name], MD;  Location: [facility];:: DES PCI mRCA: Resolute Onyx DES 4.5 mm x 26 mm (4.6 mm);    HERNIA REPAIR     LEFT HEART CATH AND CORONARY ANGIOGRAPHY  Sept 7, 2006   requiring CABG; (pLAD 90&95%, mid 75%, D1 & D2 - patent; Cx-OM1 ok, OM2 100%, distal Cx mild  disease; RCA 50-60% mid, 75% distal)   LEFT HEART CATH AND CORS/GRAFTS ANGIOGRAPHY N/A 11/14/2017   Procedure: LEFT HEART CATH AND CORS/GRAFTS ANGIOGRAPHY;  Surgeon: [name], MD;  Location: [facility]:: Ost-p LAD 99% before Mod D1 -->Patent LIMA-mLAD & SVG-D1. Ost-P Cx 40%, OM1 50%, OM2 100% CTO --> RIMA-OM2 100% CTO. mRCA 99% (small rPA 99% & rPAV 100% CTO); LVEDP 20 mmHg --> SVG-rPAV 100% (? Culptrit);;  --> PCI RCA   NM [facility] SINGLE W/SPECT  Oct 2011   LOW RISK: and no significant ischemia   TONSILLECTOMY     TRANSTHORACIC ECHOCARDIOGRAM  NOV 2012   EF 50 TO 55%,NO WALL MOTION ABNORM    TRANSTHORACIC ECHOCARDIOGRAM  11/2017   EF 40 and 45%.  GR 1 DD.  Akinesis of the mid inferior and inferolateral wall.  Relatively normal valves.    Allergies  No Known Allergies   Labs/Other Studies Reviewed    The following studies were reviewed today: [place] 2017-10-16: SVG graft was visualized by angiography. Origin lesion is 100% stenosed. Mid RCA lesion is 99% stenosed. Ost RPDA lesion is 99% stenosed. Post Atrio lesion is 100% stenosed. Ost Cx to Prox Cx lesion is 40% stenosed. Ost 1st Mrg lesion is 50% stenosed. Ost 2nd Mrg lesion is 100% stenosed. RIMA graft was visualized by angiography. Origin to Prox Graft lesion is  100% stenosed. SVG graft was visualized by angiography. Ost LAD to Prox LAD lesion is 99% stenosed. LIMA graft was visualized by angiography. A drug-eluting stent was successfully placed using a STENT RESOLUTE ONYX 4.5X26. Post intervention, there is a 0% residual stenosis.   1. 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  2. Severe stenosis proximal LAD. The mid and distal vessel fills from antegrade flow and from the patent LIMA graft. The vein graft to the diagonal branch is patent.  3. The circumflex has mild to moderate proximal vessel stenosis. The first obtuse marginal branch is small in caliber and has moderate stenosis.  The second obtuse marginal branch is occluded. The vein graft that filled this vessel is now occluded.  4. The RCA is a large dominant artery with a very unusual, high and anterior takeoff. The vessel has a 99% mid stenosis. The distal branches are occluded. The vein graft that had been sequential to both the PDA and posterolateral artery is now occluded.  5. Successful PTCA/DES x 1 mid RCA 6. Culprit for NSTEMI is felt to be occlusion of the vein graft to the PDA and posterolateral artery   Recommendations: Will continue DAPT with ASA and Brilinta for one year. Continue statin and beta blocker. Echo today. Probable d/c home tomorrow.    Echo 2019: Study Conclusions   - Left ventricle: The cavity size was normal. There was mild    concentric hypertrophy. Systolic function was mildly to    moderately reduced. The estimated ejection fraction was in the    range of 40% to 45%. Doppler parameters are consistent with    abnormal left ventricular relaxation (grade 1 diastolic    dysfunction). There was no evidence of elevated ventricular    filling pressure by Doppler parameters.  - Mitral valve: There was no regurgitation.  - Right ventricle: The cavity size was normal. Wall thickness was    normal. Systolic function was normal.  - Tricuspid valve: There was mild regurgitation.  - Pulmonary arteries: Systolic pressure was mildly increased. PA    peak pressure: 31 mm Hg (S).  - Inferior vena cava: The vessel was normal in size.   Impressions:   - There is akinesis of the basal and mid inferior and inferolateral    walls. LVEF 40-45%. RVEF appears normal.   Recent Labs: No results found for requested labs within last 365 days.  Recent Lipid Panel    Component Value Date/Time   CHOL 159 07/08/2020 1512   CHOL 162 04/23/2014 1107   TRIG 143 07/08/2020 1512   TRIG 131 04/23/2014 1107   HDL 48 07/08/2020 1512   HDL 55 04/23/2014 1107   CHOLHDL 3.3 07/08/2020 1512   CHOLHDL 3.0  11/13/2017 0043   VLDL 13 11/13/2017 0043   LDLCALC 86 07/08/2020 1512   LDLCALC 81 04/23/2014 1107    History of Present Illness    72 year old male with the above past medical history including CAD s/p CABG x 5 (frLIMA-LAD, frRIMA-OM1, SVG-D1, seq SVG-rPDA-RPL) in 2006, s/p DES-RCA in 2019, ICM,  bradycardia, hypertension, hyperlipidemia, prediabetes, polycythemia, and obesity.  Cardiac catheterization in 2019 showed severe triple-vessel CAD with 2 out of 5 patent bypass grafts, severe stenosis of the proximal LAD large dominant RCA with 99% mid stenosis, occluded distal branches occluded SVG-PDA, s/p DES-mid RCA.  Echocardiogram at the time revealed EF 40 to 45%, mild to moderately reduced LV systolic function, mild concentric LVH, G1 DD, normal RV  systolic function, no significant valvular abnormalities.  He was last seen in the office on 09/28/2021 and was stable from a cardiac standpoint.  He denied symptoms concerning for angina.  It was noted that he would likely be due for Myoview and echocardiogram within the next year.   He presents today for follow-up. Since his last visit he has done well from a cardiac standpoint. He denies symptoms concerning for angina, denies dyspnea, edema, pnd, orthopnea, weight gain. Overall, he reports feeling well.   Home Medications    Current Outpatient Medications  Medication Sig Dispense Refill   atorvastatin (LIPITOR) 40 MG tablet Take one tablet (40mg ) daily. 90 tablet 0   BRILINTA 60 MG TABS tablet Take 1 tablet (60 mg total) by mouth 2 (two) times daily. 180 tablet 3   metoprolol tartrate (LOPRESSOR) 25 MG tablet Take 1 tablet (25 mg total) by mouth 2 (two) times daily. 180 tablet 3   nitroGLYCERIN (NITROSTAT) 0.4 MG SL tablet Place 1 tablet (0.4 mg total) under the tongue every 5 (five) minutes x 3 doses as needed for chest pain. 25 tablet 1   valsartan (DIOVAN) 40 MG tablet Take 1 tablet (40 mg total) by mouth daily. 90 tablet 0   No current  facility-administered medications for this visit.     Review of Systems    He denies chest pain, palpitations, dyspnea, pnd, orthopnea, n, v, dizziness, syncope, edema, weight gain, or early satiety. All other systems reviewed and are otherwise negative except as noted above.   Physical Exam    VS:  BP 128/84 (BP Location: Left Arm, Patient Position: Sitting, Cuff Size: Large)   Pulse (!) 54   Ht 5\' 11"  (1.803 m)   Wt (!) 305 lb 3.2 oz (138.4 kg)   SpO2 93%   BMI 42.57 kg/m   GEN: Well nourished, well developed, in no acute distress. HEENT: normal. Neck: Supple, no JVD, carotid bruits, or masses. Cardiac: RRR, no murmurs, rubs, or gallops. No clubbing, cyanosis, edema.  Radials/DP/PT 2+ and equal bilaterally.  Respiratory:  Respirations regular and unlabored, clear to auscultation bilaterally. GI: Soft, nontender, nondistended, BS + x 4. MS: no deformity or atrophy. Skin: warm and dry, no rash. Neuro:  Strength and sensation are intact. Psych: Normal affect.  Accessory Clinical Findings    ECG personally reviewed by me today -sinus bradycardia, 54 bpm, RBBB- no acute changes.   Lab Results  Component Value Date   WBC 7.9 07/08/2020   HGB 16.7 07/08/2020   HCT 46.5 07/08/2020   MCV 92 07/08/2020   PLT 237 07/08/2020   Lab Results  Component Value Date   CREATININE 1.24 07/08/2020   BUN 10 07/08/2020   NA 141 07/08/2020   K 5.1 07/08/2020   CL 104 07/08/2020   CO2 24 07/08/2020   Lab Results  Component Value Date   ALT 28 07/08/2020   AST 27 07/08/2020   ALKPHOS 116 07/08/2020   BILITOT 0.9 07/08/2020   Lab Results  Component Value Date   CHOL 159 07/08/2020   HDL 48 07/08/2020   LDLCALC 86 07/08/2020   TRIG 143 07/08/2020   CHOLHDL 3.3 07/08/2020    Lab Results  Component Value Date   HGBA1C 5.9 (H) 07/09/2019    Assessment & Plan   1. CAD:  S/p CABG x 5 (frLIMA-LAD, frRIMA-OM1, SVG-D1, seq SVG-rPDA-RPL) in 2006, s/p DES-RCA in 2019. Stable with no  anginal symptoms.  Dr. Allison Quarry prior note mentioned possible Myoview for  routine monitoring, however, patient declines any additional testing at this time.  Continue Brilinta, metoprolol, valsartan, Lipitor.  Will update labs including CBC, fasting lipid panel, CMET.  Dr. Ellyn Hack also intended to update PSA and TSH per last year's orders. Will also update per pt request.   2. ICM: Most recent echo in 2019 showed EF 40 to 45%, mild to moderately reduced LV systolic function, mild concentric LVH, G1 DD, normal RV systolic function, no significant valvular abnormalities. Euvolemic and well compensated on exam.  Discussed possibility of repeat echocardiogram, however, patient declines at this time.  Continue current medications as above.  3. Bradycardia: Stable. Asymptomatic. Continue metoprolol.   4. Hypertension: BP well controlled. Continue current antihypertensive regimen.   5. Hyperlipidemia: LDL was 86 in 07/2020. Will update fasting lipids, CMET. Continue Lipitor.   6. Prediabetes: No recent A1c on file.  Will update A1c.   7. Obesity: Encouraged ongoing lifestyle modification with diet and exercise.   8. Disposition: Follow-up in 1 year.      Lenna Sciara, NP 10/05/2022, 3:00 PM

## 2022-10-05 NOTE — Patient Instructions (Signed)
Medication Instructions:  Your physician recommends that you continue on your current medications as directed. Please refer to the Current Medication list given to you today.   *If you need a refill on your cardiac medications before your next appointment, please call your pharmacy*   Lab Work: Will contact you for lab work.     Testing/Procedures: None ordered   Follow-Up: At [facility], you and your health needs are our priority.  As part of our continuing mission to provide you with exceptional heart care, we have created designated Provider Care Teams.  These Care Teams include your primary Cardiologist (physician) and Advanced Practice Providers (APPs -  Physician Assistants and Nurse Practitioners) who all work together to provide you with the care you need, when you need it.  We recommend signing up for the patient portal called "MyChart".  Sign up information is provided on this After Visit Summary.  MyChart is used to connect with patients for Virtual Visits (Telemedicine).  Patients are able to view lab/test results, encounter notes, upcoming appointments, etc.  Non-urgent messages can be sent to your provider as well.   To learn more about what you can do with MyChart, go to [email or web address].    Your next appointment:   1 year(s)  Provider:   Glenetta Hew, MD

## 2022-10-07 ENCOUNTER — Other Ambulatory Visit: Payer: Self-pay | Admitting: *Deleted

## 2022-10-07 MED ORDER — TICAGRELOR 60 MG PO TABS: ORAL_TABLET | ORAL | 3 refills | Status: DC

## 2022-11-09 ENCOUNTER — Other Ambulatory Visit: Payer: Self-pay | Admitting: Cardiology

## 2023-01-17 DIAGNOSIS — H11121 Conjunctival concretions, right eye: Secondary | ICD-10-CM | POA: Diagnosis not present

## 2023-02-07 DIAGNOSIS — H01001 Unspecified blepharitis right upper eyelid: Secondary | ICD-10-CM | POA: Diagnosis not present

## 2023-02-07 DIAGNOSIS — H04121 Dry eye syndrome of right lacrimal gland: Secondary | ICD-10-CM | POA: Diagnosis not present

## 2023-02-07 DIAGNOSIS — H11121 Conjunctival concretions, right eye: Secondary | ICD-10-CM | POA: Diagnosis not present

## 2023-08-23 ENCOUNTER — Other Ambulatory Visit: Payer: Self-pay

## 2023-08-23 MED ORDER — METOPROLOL TARTRATE 25 MG PO TABS: 25.0000 mg | ORAL_TABLET | Freq: Two times a day (BID) | ORAL | 0 refills | Status: DC

## 2023-10-10 ENCOUNTER — Ambulatory Visit: Payer: PPO | Attending: Cardiology | Admitting: Cardiology

## 2023-10-10 ENCOUNTER — Encounter: Payer: Self-pay | Admitting: Cardiology

## 2023-10-10 VITALS — BP 135/73 | HR 59 | Ht 71.0 in | Wt 275.0 lb

## 2023-10-10 DIAGNOSIS — Z951 Presence of aortocoronary bypass graft: Secondary | ICD-10-CM | POA: Diagnosis not present

## 2023-10-10 DIAGNOSIS — R7303 Prediabetes: Secondary | ICD-10-CM | POA: Diagnosis not present

## 2023-10-10 DIAGNOSIS — I255 Ischemic cardiomyopathy: Secondary | ICD-10-CM | POA: Diagnosis not present

## 2023-10-10 DIAGNOSIS — I214 Non-ST elevation (NSTEMI) myocardial infarction: Secondary | ICD-10-CM | POA: Diagnosis not present

## 2023-10-10 DIAGNOSIS — I2510 Atherosclerotic heart disease of native coronary artery without angina pectoris: Secondary | ICD-10-CM

## 2023-10-10 DIAGNOSIS — I10 Essential (primary) hypertension: Secondary | ICD-10-CM

## 2023-10-10 DIAGNOSIS — E785 Hyperlipidemia, unspecified: Secondary | ICD-10-CM

## 2023-10-10 LAB — LIPID PANEL

## 2023-10-10 MED ORDER — ASPIRIN 81 MG PO TBEC: 81.0000 mg | DELAYED_RELEASE_TABLET | Freq: Every day | ORAL | Status: AC

## 2023-10-10 MED ORDER — VALSARTAN 40 MG PO TABS: 40.0000 mg | ORAL_TABLET | Freq: Every day | ORAL | 0 refills | Status: DC

## 2023-10-10 NOTE — Patient Instructions (Addendum)
 Was medication Instructions:   Stop taking Brilinta Star taking Aspirin 81 mg daily   *If you need a refill on your cardiac medications before your next appointment, please call your pharmacy*   Lab Work: fasting   TSH CMP LIPID HgbA1c CBC  If you have labs (blood work) drawn today and your tests are completely normal, you will receive your results only by: MyChart Message (if you have MyChart) OR A paper copy in the mail If you have any lab test that is abnormal or we need to change your treatment, we will call you to review the results.   Testing/Procedures: Not needed   Follow-Up: At [facility], you and your health needs are our priority.  As part of our continuing mission to provide you with exceptional heart care, we have created designated Provider Care Teams.  These Care Teams include your primary Cardiologist (physician) and Advanced Practice Providers (APPs -  Physician Assistants and Nurse Practitioners) who all work together to provide you with the care you need, when you need it.     Your next appointment:   12 month(s)  The format for your next appointment:   In Person  Provider:   Bryan Lemma, MD    Other Instructions

## 2023-10-10 NOTE — Progress Notes (Unsigned)
 Cardiology Office Note:  .   Date:  10/10/2023  ID:  Jeremy [name], DOB Nov 08, 1950, MRN [id number] PCP: Street, Stephanie Coup, MD  [facility] Providers Cardiologist:  Bryan Lemma, MD { Click to update primary MD,subspecialty MD or APP then REFRESH:1}    No chief complaint on file.   Patient Profile: .     Jeremy [name] is an obese 73 y.o. male  with a PMH noted below who presents here for annual f/u at the request of Street, Stephanie Coup, *.  Pertinent PMH: CAD-CABG x 5 (frLIMA-LAD, frRIMA-OM1, SVG-D1, seq SVG-rPDA-RPL) in 2006,  s/p DES-RCA in 2019,  ICM - EF 40-45% Bradycardia,  Hypertension,  Hyperlipidemia,  Prediabetes,  Polycythemia,    Jeremy [name] was last seen on September 04, 2022 by Bernadene Person, NP.  I actually had seen him in March or 2023 and results also stable at that time point.  Denied any angina or significant exertional dyspnea.  No orthopnea or PND.  Stable borderline bradycardia maintenance dose Brilinta lipid panel updated  Subjective  Discussed the use of AI scribe software for clinical note transcription with the patient, who gave verbal consent to proceed.  History of Present Illness   Cardiovascular ROS: {roscv:310661}  ROS:  Review of Systems - {ros master:310782}    Objective    Studies Reviewed: [name]   EKG Interpretation Date/Time:  Monday October 10 2023 13:27:05 EDT Ventricular Rate:  59 PR Interval:  172 QRS Duration:  126 QT Interval:  452 QTC Calculation: 447 R Axis:   -74  Text Interpretation: Sinus bradycardia Left axis deviation Right bundle branch block When compared with ECG of 15-Nov-2017 06:03, T wave inversion no longer evident in Inferior leads T wave inversion no longer evident in Anterolateral leads Confirmed by Bryan Lemma ([id number]) on 10/10/2023 1:30:14 PM    ECHO 11/14/2017:: LVEF 40 to 45% with basal to mid inferior and inferolateral akinesis.  G1 DD.  CATH-PCI 11/14/2017: Severe 3V CAD with 2/5 patent grafts.  99%  proximal LAD, 99% proximal and mid RCA with 100% RPL and 99% RPDA, ostial LCx 40%, ostial OM1 50%, ostial OM2 100%.  Patent LIMA-LAD, patent SVG-D1.  100% SVG-OM 2 and SVG-distal RCA.  DES PCI to mid RCA with 4.5 x 26 mm Resolute Onyx DES Post-Intervention  Lab Results  Component 07/08/2020 09/28/2021   CHOL 159    HDL 48    LDLCALC 86    TRIG 143    CHOLHDL 3.3    Lab Results  Component Value Date   NA 141 07/08/2020   K 5.1 07/08/2020   CREATININE 1.24 07/08/2020   GFRNONAA 59 (L) 07/08/2020   GLUCOSE 86 07/08/2020    Risk Assessment/Calculations:     The patient's 1st BP is elevated (>139/89)*** Repeat BP and {Click to enter a 2nd BP Refresh Note  :1}        Physical Exam:   VS:  BP (!) 142/85   Pulse (!) 59   Ht 5\' 11"  (1.803 m)   Wt 275 lb (124.7 kg)   SpO2 95%   BMI 38.35 kg/m    Wt Readings from Last 3 Encounters:  10/10/23 275 lb (124.7 kg)  10/05/22 (!) 305 lb 3.2 oz (138.4 kg)  09/28/21 292 lb 12.8 oz (132.8 kg)    GEN: Well nourished, well developed in no acute distress; *** NECK: No JVD; No carotid bruits CARDIAC: Normal S1, S2; RRR, no murmurs, rubs, gallops RESPIRATORY:  Clear  to auscultation without rales, wheezing or rhonchi ; nonlabored, good air movement. ABDOMEN: Soft, non-tender, non-distended EXTREMITIES:  No edema; No deformity      ASSESSMENT AND PLAN: .    Problem List Items Addressed This Visit       Cardiology Problems   Atherosclerotic heart disease of native coronary artery without angina pectoris (Chronic)   Essential hypertension - Primary (Chronic)   Relevant Orders   EKG 12-Lead (Completed)   Ischemic cardiomyopathy (Chronic)   NSTEMI (non-ST elevated myocardial infarction) (HCC) (Chronic)     Other   Morbid obesity (HCC) (Chronic)   Prediabetes (Chronic)   S/P CABG x 5 (Chronic)    Assessment and Plan Assessment & Plan        {Are you ordering a CV Procedure (e.g. stress test, cath, DCCV, TEE, etc)?   Press F2         :[id number]}   Follow-Up: No follow-ups on file.  Total time spent: *** min spent with patient + *** min spent charting = *** min    Signed, Marykay Lex, MD, MS Bryan Lemma, M.D., M.S. Interventional Cardiologist  [facility]  Pager # [phone] Phone # [phone] [address] [place], [place] [address]

## 2023-10-11 LAB — COMPREHENSIVE METABOLIC PANEL WITH GFR
ALT: 30 IU/L (ref 0–44)
AST: 31 IU/L (ref 0–40)
Albumin: 4.1 g/dL (ref 3.8–4.8)
Alkaline Phosphatase: 110 IU/L (ref 44–121)
BUN/Creatinine Ratio: 15 (ref 10–24)
BUN: 14 mg/dL (ref 8–27)
Bilirubin Total: 0.5 mg/dL (ref 0.0–1.2)
CO2: 20 mmol/L (ref 20–29)
Calcium: 9.8 mg/dL (ref 8.6–10.2)
Chloride: 101 mmol/L (ref 96–106)
Creatinine, Ser: 0.96 mg/dL (ref 0.76–1.27)
Globulin, Total: 3.4 g/dL (ref 1.5–4.5)
Glucose: 98 mg/dL (ref 70–99)
Potassium: 4.7 mmol/L (ref 3.5–5.2)
Sodium: 139 mmol/L (ref 134–144)
Total Protein: 7.5 g/dL (ref 6.0–8.5)
eGFR: 85 mL/min/{1.73_m2} (ref >59)

## 2023-10-11 LAB — CBC
Hematocrit: 46.7 % (ref 37.5–51.0)
Hemoglobin: 15.8 g/dL (ref 13.0–17.7)
MCH: 31 pg (ref 26.6–33.0)
MCHC: 33.8 g/dL (ref 31.5–35.7)
MCV: 92 fL (ref 79–97)
Platelets: 333 10*3/uL (ref 150–450)
RBC: 5.1 x10E6/uL (ref 4.14–5.80)
RDW: 11.9 % (ref 11.6–15.4)
WBC: 8.7 10*3/uL (ref 3.4–10.8)

## 2023-10-11 LAB — TSH: TSH: 4.04 u[IU]/mL (ref 0.450–4.500)

## 2023-10-11 LAB — LIPID PANEL
Cholesterol, Total: 143 mg/dL (ref 100–199)
HDL: 41 mg/dL (ref >39)
LDL CALC COMMENT:: 3.5 ratio (ref 0.0–5.0)
LDL Chol Calc (NIH): 84 mg/dL (ref 0–99)
Triglycerides: 93 mg/dL (ref 0–149)
VLDL Cholesterol Cal: 18 mg/dL (ref 5–40)

## 2023-10-11 LAB — HEMOGLOBIN A1C
Est. average glucose Bld gHb Est-mCnc: 128 mg/dL
Hgb A1c MFr Bld: 6.1 % — ABNORMAL HIGH (ref 4.8–5.6)

## 2023-10-11 LAB — PSA: Prostate Specific Ag, Serum: 1.3 ng/mL (ref 0.0–4.0)

## 2023-10-13 ENCOUNTER — Encounter: Payer: Self-pay | Admitting: Cardiology

## 2023-10-13 NOTE — Assessment & Plan Note (Signed)
 LDL not controlled as of 2022 but have not seen labs since then. Labs ordered today showed LDL of 84 on 40 mg atorvastatin. -Continue 40 mg atorvastatin until current prescription completed and then changed to rosuvastatin 40 mg daily - reassess lipid panel in 3 to 4 months after initiating medication. -Next step will be to to add Nexlizet 180-10 mg daily.  if covered, if not we will start with ezetimibe 10 mg daily

## 2023-10-13 NOTE — Assessment & Plan Note (Signed)
 Lost 20 - 30 pounds, adhering to dietary changes, declined pharmacological interventions. - Encourage continued dietary modifications and weight loss efforts. - Low threshold to consider GLP-1 agonist if he were to have difficulty or plateauing with weight loss.  (Defer to PCP)

## 2023-10-13 NOTE — Assessment & Plan Note (Signed)
 Aware of status, implementing dietary changes.  History of hyperglycemia in the past - Order A1c as part of blood work. - Encourage continued dietary modifications.

## 2023-10-13 NOTE — Assessment & Plan Note (Signed)
 Last ischemic evaluation was at the time of his MI back in 2019.  Theoretically will be due for follow-up stress test in the next 1 to 2 years.  Can discuss in follow-up.

## 2023-10-13 NOTE — Assessment & Plan Note (Signed)
 Non-STEMI in May 2019 with reduction in EF to 40 to 45%.  Noted to have occluded vein graft to the OM 2 and RPL.  OM2 was occluded and therefore not revascularizable, but the native RCA was stented restoring flow to the PDA with some collaterals to the PL system. Thankfully, he has not really had any further anginal symptoms since then.  On stable dose medications.  Major issue is he remains obese although he has lost about 30 pounds since last year.

## 2023-10-13 NOTE — Assessment & Plan Note (Signed)
 Post MI EF 40 to 45% on Echo, but no real significant symptoms of CHF: NYHA class II mostly because of some exertional dyspnea.  That can easily be explained by his obesity as well.  GDMT:  On Lopressor 25 mg twice daily-low threshold to consolidate to Toprol 50 mg daily On valsartan 40 mg daily, refilled today. Not either loop diuretic or spironolactone-euvolemic without any PND or orthopnea.

## 2023-10-13 NOTE — Assessment & Plan Note (Signed)
 Six years post-non-STEMI with PCI to native RCA leaving only RPL and OM 2 not revascularized. Stable symptoms,  EKG shows RBBB and LAFB, heart function impaired post-MI.  Discussed switching from Brilinta to aspirin due to lack of long-term data for Brilinta beyond two years. - Refill valsartan 40 mg daily. - Switch from Brilinta to aspirin 81 mg daily. - Continue atorvastatin 40 mg daily .- Order blood work: lipids, chemistry, A1c, TSH, PSA. - Continue metoprolol tartrate 25 mg twice daily => consider converting to Toprol-XL 50 mg daily

## 2023-10-13 NOTE — Assessment & Plan Note (Signed)
 Blood pressure slightly elevated on initial readings here today, but improved on follow-up.  This morning his pressures much better.  He notes fluctuating pressures therefore reluctant to push medications too far.  Current regimen supports cardiac function over hypertension control: Lopressor 25 mg twice daily and Diovan 40 mg daily. - Monitor blood pressure at home. - Continue current medication regimen.

## 2023-10-14 ENCOUNTER — Encounter: Payer: Self-pay | Admitting: *Deleted

## 2023-10-14 ENCOUNTER — Other Ambulatory Visit: Payer: Self-pay | Admitting: *Deleted

## 2023-10-14 ENCOUNTER — Telehealth: Payer: Self-pay | Admitting: *Deleted

## 2023-10-14 DIAGNOSIS — I2510 Atherosclerotic heart disease of native coronary artery without angina pectoris: Secondary | ICD-10-CM

## 2023-10-14 DIAGNOSIS — E785 Hyperlipidemia, unspecified: Secondary | ICD-10-CM

## 2023-10-14 MED ORDER — ROSUVASTATIN CALCIUM 40 MG PO TABS: 40.0000 mg | ORAL_TABLET | Freq: Every day | ORAL | 3 refills | Status: AC

## 2023-10-14 NOTE — Telephone Encounter (Signed)
 The patient has been notified of the result and verbalized understanding.  All questions (if any) were answered.  Rosuvastatin 40 mg sent to pharmacy  and lab order placed Tobin Chad, RN 10/14/2023 9:04 AM

## 2023-10-14 NOTE — Telephone Encounter (Signed)
-----   Message from Bryan Lemma sent at 10/13/2023  1:12 AM EDT ----- Lab results: Hemoglobin A1c is up a little bit to 6.1.  This is in the prediabetes range.  Closely monitor diet and try to increase exercise.  With the weight loss hopefully this will improve.  Chemistry panel looks great.  Kidney function liver function both look good.  Electrolytes look great.  Thyroid level also looks good.  Blood counts look great. PSA is 1.3 which is up a little bit for years.  Will send this to Dr. Casper Harrison.  Cholesterol levels are okay, but not at goal.  We want the LDL to be closer to 55 and is currently 84.  Total cholesterol, triglycerides and HDL all look pretty good. Recommendation is to Continue 40 mg atorvastatin until current prescription completed and then we will change to rosuvastatin 40 mg daily - reassess lipid panel in 3 to 4 months after initiating medication.  New Rx rosuvastatin 40 mg daily; dispense #90 tabs, 3 refills (start Completion of current atorvastatin bottle)

## 2023-10-28 NOTE — Telephone Encounter (Signed)
 Patient states he have received a copy of his lab results. Please advise

## 2023-11-05 ENCOUNTER — Other Ambulatory Visit: Payer: Self-pay | Admitting: Cardiology

## 2023-12-01 DIAGNOSIS — I5022 Chronic systolic (congestive) heart failure: Secondary | ICD-10-CM | POA: Diagnosis not present

## 2023-12-01 DIAGNOSIS — I25810 Atherosclerosis of coronary artery bypass graft(s) without angina pectoris: Secondary | ICD-10-CM | POA: Diagnosis not present

## 2023-12-01 DIAGNOSIS — N401 Enlarged prostate with lower urinary tract symptoms: Secondary | ICD-10-CM | POA: Diagnosis not present

## 2023-12-01 DIAGNOSIS — E785 Hyperlipidemia, unspecified: Secondary | ICD-10-CM | POA: Diagnosis not present

## 2023-12-01 DIAGNOSIS — E669 Obesity, unspecified: Secondary | ICD-10-CM | POA: Diagnosis not present

## 2023-12-01 DIAGNOSIS — I252 Old myocardial infarction: Secondary | ICD-10-CM | POA: Diagnosis not present

## 2023-12-01 DIAGNOSIS — I420 Dilated cardiomyopathy: Secondary | ICD-10-CM | POA: Diagnosis not present

## 2023-12-01 DIAGNOSIS — I255 Ischemic cardiomyopathy: Secondary | ICD-10-CM | POA: Diagnosis not present

## 2023-12-01 DIAGNOSIS — I10 Essential (primary) hypertension: Secondary | ICD-10-CM | POA: Diagnosis not present

## 2023-12-01 DIAGNOSIS — Z0000 Encounter for general adult medical examination without abnormal findings: Secondary | ICD-10-CM | POA: Diagnosis not present

## 2023-12-01 DIAGNOSIS — I25119 Atherosclerotic heart disease of native coronary artery with unspecified angina pectoris: Secondary | ICD-10-CM | POA: Diagnosis not present

## 2023-12-01 DIAGNOSIS — R351 Nocturia: Secondary | ICD-10-CM | POA: Diagnosis not present

## 2023-12-09 ENCOUNTER — Other Ambulatory Visit: Payer: Self-pay | Admitting: Cardiology

## 2024-02-01 ENCOUNTER — Other Ambulatory Visit: Payer: Self-pay

## 2024-02-01 MED ORDER — VALSARTAN 40 MG PO TABS: 40.0000 mg | ORAL_TABLET | Freq: Every day | ORAL | 3 refills | Status: AC

## 2024-02-08 DIAGNOSIS — Z961 Presence of intraocular lens: Secondary | ICD-10-CM | POA: Diagnosis not present
